# Patient Record
Sex: Male | Born: 1945 | Race: Black or African American | Hispanic: No | Marital: Married | Smoking: Former smoker
Health system: Southern US, Community
[De-identification: ages and names within clinical notes are randomized; demographics above are authoritative.]

## PROBLEM LIST (undated history)

## (undated) DIAGNOSIS — G4730 Sleep apnea, unspecified: Secondary | ICD-10-CM

## (undated) DIAGNOSIS — Z974 Presence of external hearing-aid: Secondary | ICD-10-CM

## (undated) DIAGNOSIS — I10 Essential (primary) hypertension: Secondary | ICD-10-CM

## (undated) DIAGNOSIS — Z972 Presence of dental prosthetic device (complete) (partial): Secondary | ICD-10-CM

## (undated) DIAGNOSIS — E669 Obesity, unspecified: Secondary | ICD-10-CM

## (undated) DIAGNOSIS — E119 Type 2 diabetes mellitus without complications: Secondary | ICD-10-CM

## (undated) DIAGNOSIS — E785 Hyperlipidemia, unspecified: Secondary | ICD-10-CM

## (undated) DIAGNOSIS — M1990 Unspecified osteoarthritis, unspecified site: Secondary | ICD-10-CM

## (undated) DIAGNOSIS — E1169 Type 2 diabetes mellitus with other specified complication: Secondary | ICD-10-CM

---

## 2006-04-28 ENCOUNTER — Ambulatory Visit: Payer: Self-pay | Admitting: Podiatry

## 2012-06-24 DIAGNOSIS — N529 Male erectile dysfunction, unspecified: Secondary | ICD-10-CM | POA: Insufficient documentation

## 2012-09-16 DIAGNOSIS — E782 Mixed hyperlipidemia: Secondary | ICD-10-CM | POA: Insufficient documentation

## 2012-09-16 DIAGNOSIS — E1149 Type 2 diabetes mellitus with other diabetic neurological complication: Secondary | ICD-10-CM | POA: Insufficient documentation

## 2012-09-16 DIAGNOSIS — I10 Essential (primary) hypertension: Secondary | ICD-10-CM | POA: Insufficient documentation

## 2012-09-16 DIAGNOSIS — E291 Testicular hypofunction: Secondary | ICD-10-CM | POA: Insufficient documentation

## 2013-10-03 DIAGNOSIS — M25561 Pain in right knee: Secondary | ICD-10-CM | POA: Insufficient documentation

## 2017-12-21 DIAGNOSIS — Z7409 Other reduced mobility: Secondary | ICD-10-CM | POA: Insufficient documentation

## 2019-04-05 DIAGNOSIS — I872 Venous insufficiency (chronic) (peripheral): Secondary | ICD-10-CM | POA: Insufficient documentation

## 2019-07-12 DIAGNOSIS — E785 Hyperlipidemia, unspecified: Secondary | ICD-10-CM | POA: Insufficient documentation

## 2019-07-12 DIAGNOSIS — M1990 Unspecified osteoarthritis, unspecified site: Secondary | ICD-10-CM | POA: Insufficient documentation

## 2019-08-19 DIAGNOSIS — I872 Venous insufficiency (chronic) (peripheral): Secondary | ICD-10-CM

## 2019-08-19 HISTORY — DX: Venous insufficiency (chronic) (peripheral): I87.2

## 2019-08-30 ENCOUNTER — Other Ambulatory Visit (HOSPITAL_COMMUNITY): Payer: Self-pay | Admitting: Physician Assistant

## 2019-08-30 ENCOUNTER — Other Ambulatory Visit: Payer: Self-pay | Admitting: Physician Assistant

## 2019-08-30 DIAGNOSIS — H938X1 Other specified disorders of right ear: Secondary | ICD-10-CM

## 2019-09-07 ENCOUNTER — Ambulatory Visit
Admission: RE | Admit: 2019-09-07 | Discharge: 2019-09-07 | Disposition: A | Discharge status: Home / Self Care | Payer: Medicare Other | Source: Ambulatory Visit | Attending: Physician Assistant | Admitting: Physician Assistant

## 2019-09-07 ENCOUNTER — Other Ambulatory Visit: Payer: Self-pay

## 2019-09-07 DIAGNOSIS — H938X1 Other specified disorders of right ear: Secondary | ICD-10-CM | POA: Diagnosis present

## 2019-09-07 HISTORY — DX: Type 2 diabetes mellitus without complications: E11.9

## 2019-09-07 LAB — POCT I-STAT CREATININE: Creatinine, Ser: 0.9 mg/dL (ref 0.61–1.24)

## 2019-09-07 MED ORDER — IOHEXOL 300 MG/ML  SOLN
75.0000 mL | Freq: Once | INTRAMUSCULAR | Status: AC | PRN
  Administered 2019-09-07: 75 mL via INTRAVENOUS

## 2019-10-04 ENCOUNTER — Encounter: Payer: Self-pay | Admitting: Unknown Physician Specialty

## 2019-10-04 ENCOUNTER — Other Ambulatory Visit: Payer: Self-pay

## 2019-10-04 ENCOUNTER — Encounter
Admission: RE | Admit: 2019-10-04 | Discharge: 2019-10-04 | Disposition: A | Discharge status: Home / Self Care | Payer: Medicare Other | Source: Ambulatory Visit | Attending: Unknown Physician Specialty | Admitting: Unknown Physician Specialty

## 2019-10-04 HISTORY — DX: Unspecified osteoarthritis, unspecified site: M19.90

## 2019-10-04 NOTE — Pre-Procedure Instructions (Signed)
Several attempts have been made to reach patient via his home phone number and his cell, with no success.

## 2019-10-04 NOTE — Patient Instructions (Signed)
INSTRUCTIONS FOR SURGERY     Your surgery is scheduled for:   Tuesday, October 11, 2019     To find out your arrival time for the day of surgery,          please call [phone] between 1 pm and 3 pm on :  Monday, February 22,2021     When you arrive for surgery, report to the SECOND FLOOR OF THE MEDICAL MALL.       Do NOT stop on the first floor to register.    REMEMBER: Instructions that are not followed completely may result in serious medical risk,  up to and including death, or upon the discretion of your surgeon and anesthesiologist,            your surgery may need to be rescheduled.  __X__ 1. Do not eat food after midnight the night before your procedure.                    No gum, candy, lozenger, tic tacs, tums or hard candies.                  ABSOLUTELY NOTHING SOLID IN YOUR MOUTH AFTER MIDNIGHT                    You may drink unlimited clear liquids up to 2 hours before you are scheduled to arrive for surgery.                   Do not drink anything within those 2 hours unless you need to take medicine, then take the                   smallest amount you need.  Clear liquids include:  water, apple juice without pulp,                   any flavor Gatorade, Black coffee, black tea.  Sugar may be added but no dairy/ honey /lemon.                        Broth and jello is not considered a clear liquid.  __x__  2. On the morning of surgery, please brush your teeth with toothpaste and water. You may rinse with                  mouthwash if you wish but DO NOT SWALLOW TOOTHPASTE OR MOUTHWASH  __X___3. NO alcohol for 24 hours before or after surgery.  __x___ 4.  Do NOT smoke or use e-cigarettes for 24 HOURS PRIOR TO SURGERY.                      DO NOT Use any chewable tobacco products for at least 6 hours prior to surgery.  __x___ 5. If you start any new medication after this appointment and prior to surgery, please                 Bring it with you on the day of surgery.  ___x__ 6. Notify your doctor if there is any change in your medical  condition, such as fever, infection, vomitting,                   Diarrhea or any open sores.  __x___ 7.  USE ANTIBACTERIAL SOAP as instructed, the night before surgery OR the day of surgery.                   Once you have washed with this soap, do NOT use any of the following: Powders, perfumes                    or lotions. Please do not wear make up, hairpins, clips or nail polish. You MAY wear deodorant.                   Men may shave their face and neck.  Women need to shave 48 hours prior to surgery.                   DO NOT wear ANY jewelry on the day of surgery. If there are rings that are too tight to                    remove easily, please address this prior to the surgery day. Piercings need to be removed.                                                                     NO METAL ON YOUR BODY.                    Do NOT bring any valuables.  If you came to Pre-Admit testing then you will not need license,                     insurance card or credit card.  If you will be staying overnight, please either leave your things in                     the car or have your family be responsible for these items.                     [facility] IS NOT RESPONSIBLE FOR BELONGINGS OR VALUABLES.  ___X__ 8. DO NOT wear contact lenses on surgery day.  You may not have dentures,                     Hearing aides, contacts or glasses in the operating room. These items can be                    Placed in the Recovery Room to receive immediately after surgery.  __x___ 9. IF YOU ARE SCHEDULED TO GO HOME ON THE SAME DAY, YOU MUST                   Have someone to drive you home and to stay with you  for the first 24 hours.                    Have an arrangement prior to arriving on surgery day.  ___x__ 10. Take the following medications on the morning of  surgery with a sip of  water:                              1.  AMLODIPINE                     2.                     3.                                                _____ 11.  Follow any instructions provided to you by your surgeon.                        Such as enema, clear liquid bowel prep  __X__  12. STOP ASPIRIN AS OF:  TODAY, February 16TH, 2021                       THIS INCLUDES BC POWDERS / GOODIES POWDER  __x___ 13. STOP Anti-inflammatories as of:   TODAY, February 16TH, 2021                      This includes IBUPROFEN / MOTRIN / ADVIL / ALEVE/ NAPROXYN                    YOU MAY TAKE TYLENOL ANY TIME PRIOR TO SURGERY.  _____ 14.  Stop supplements until after surgery.                     This includes:  MULTIVITAMINS // VITAMIN B12   ___X___16.  Stop Metformin 2 full days prior to surgery.  Stop on: Saturday, February 20TH                     TAKE 1/2 OF USUAL INSULIN DOSE ON THE EVENING PRIOR TO SURGERY.                     Do NOT take any diabetes medications on surgery day.  CONTINUE TAKING ATORVASTATIN AND LASIX IN THE EVENING AS USUAL.  __X____17.  Continue to take the following medications but do not take on the morning of surgery:                        LISINOPRIL // METFORMIN  ______18. If staying overnight, please have appropriate shoes to wear to be able to walk around the unit.                   Wear clean and comfortable clothing to the hospital.

## 2019-10-07 ENCOUNTER — Encounter
Admission: RE | Admit: 2019-10-07 | Discharge: 2019-10-07 | Disposition: A | Discharge status: Home / Self Care | Payer: Medicare Other | Source: Ambulatory Visit | Attending: Unknown Physician Specialty | Admitting: Unknown Physician Specialty

## 2019-10-07 ENCOUNTER — Other Ambulatory Visit: Payer: Self-pay

## 2019-10-07 ENCOUNTER — Other Ambulatory Visit: Admission: RE | Admit: 2019-10-07 | Payer: Medicare Other | Source: Ambulatory Visit

## 2019-10-07 DIAGNOSIS — I10 Essential (primary) hypertension: Secondary | ICD-10-CM | POA: Insufficient documentation

## 2019-10-07 DIAGNOSIS — E119 Type 2 diabetes mellitus without complications: Secondary | ICD-10-CM | POA: Insufficient documentation

## 2019-10-07 DIAGNOSIS — Z01818 Encounter for other preprocedural examination: Secondary | ICD-10-CM | POA: Insufficient documentation

## 2019-10-07 DIAGNOSIS — Z20822 Contact with and (suspected) exposure to covid-19: Secondary | ICD-10-CM | POA: Diagnosis not present

## 2019-10-07 LAB — BASIC METABOLIC PANEL
Anion gap: 9 (ref 5–15)
BUN: 15 mg/dL (ref 8–23)
CO2: 26 mmol/L (ref 22–32)
Calcium: 8.7 mg/dL — ABNORMAL LOW (ref 8.9–10.3)
Chloride: 105 mmol/L (ref 98–111)
Creatinine, Ser: 0.87 mg/dL (ref 0.61–1.24)
GFR calc Af Amer: >60 mL/min (ref >60)
GFR calc non Af Amer: >60 mL/min (ref >60)
Glucose, Bld: 65 mg/dL — ABNORMAL LOW (ref 70–99)
Potassium: 3.8 mmol/L (ref 3.5–5.1)
Sodium: 140 mmol/L (ref 135–145)

## 2019-10-07 LAB — SARS CORONAVIRUS 2 (TAT 6-24 HRS): SARS Coronavirus 2: NEGATIVE

## 2019-10-11 ENCOUNTER — Ambulatory Visit: Payer: Medicare Other | Admitting: Certified Registered Nurse Anesthetist

## 2019-10-11 ENCOUNTER — Encounter: Payer: Self-pay | Admitting: Unknown Physician Specialty

## 2019-10-11 ENCOUNTER — Other Ambulatory Visit: Payer: Self-pay

## 2019-10-11 ENCOUNTER — Encounter
Admission: RE | Disposition: A | Discharge status: Home / Self Care | Payer: Self-pay | Source: Home / Self Care | Attending: Unknown Physician Specialty

## 2019-10-11 ENCOUNTER — Ambulatory Visit
Admission: RE | Admit: 2019-10-11 | Discharge: 2019-10-11 | Disposition: A | Discharge status: Home / Self Care | Payer: Medicare Other | Attending: Unknown Physician Specialty | Admitting: Unknown Physician Specialty

## 2019-10-11 DIAGNOSIS — E785 Hyperlipidemia, unspecified: Secondary | ICD-10-CM | POA: Insufficient documentation

## 2019-10-11 DIAGNOSIS — H7441 Polyp of right middle ear: Secondary | ICD-10-CM | POA: Diagnosis present

## 2019-10-11 DIAGNOSIS — Z6841 Body Mass Index (BMI) 40.0 and over, adult: Secondary | ICD-10-CM | POA: Diagnosis not present

## 2019-10-11 DIAGNOSIS — G4730 Sleep apnea, unspecified: Secondary | ICD-10-CM | POA: Insufficient documentation

## 2019-10-11 DIAGNOSIS — E669 Obesity, unspecified: Secondary | ICD-10-CM | POA: Insufficient documentation

## 2019-10-11 DIAGNOSIS — Z7982 Long term (current) use of aspirin: Secondary | ICD-10-CM | POA: Insufficient documentation

## 2019-10-11 DIAGNOSIS — E119 Type 2 diabetes mellitus without complications: Secondary | ICD-10-CM | POA: Diagnosis not present

## 2019-10-11 DIAGNOSIS — Z794 Long term (current) use of insulin: Secondary | ICD-10-CM | POA: Diagnosis not present

## 2019-10-11 DIAGNOSIS — Z87891 Personal history of nicotine dependence: Secondary | ICD-10-CM | POA: Diagnosis not present

## 2019-10-11 DIAGNOSIS — I10 Essential (primary) hypertension: Secondary | ICD-10-CM | POA: Diagnosis not present

## 2019-10-11 DIAGNOSIS — Z79899 Other long term (current) drug therapy: Secondary | ICD-10-CM | POA: Insufficient documentation

## 2019-10-11 DIAGNOSIS — M1990 Unspecified osteoarthritis, unspecified site: Secondary | ICD-10-CM | POA: Diagnosis not present

## 2019-10-11 DIAGNOSIS — I872 Venous insufficiency (chronic) (peripheral): Secondary | ICD-10-CM | POA: Diagnosis not present

## 2019-10-11 DIAGNOSIS — Z833 Family history of diabetes mellitus: Secondary | ICD-10-CM | POA: Insufficient documentation

## 2019-10-11 HISTORY — DX: Essential (primary) hypertension: I10

## 2019-10-11 HISTORY — DX: Sleep apnea, unspecified: G47.30

## 2019-10-11 HISTORY — PX: MASS EXCISION: SHX2000

## 2019-10-11 HISTORY — DX: Type 2 diabetes mellitus with other specified complication: E11.69

## 2019-10-11 HISTORY — DX: Obesity, unspecified: E66.9

## 2019-10-11 HISTORY — DX: Type 2 diabetes mellitus with other specified complication: E78.5

## 2019-10-11 LAB — GLUCOSE, CAPILLARY
Glucose-Capillary: 138 mg/dL — ABNORMAL HIGH (ref 70–99)
Glucose-Capillary: 127 mg/dL — ABNORMAL HIGH (ref 70–99)

## 2019-10-11 SURGERY — EXCISION MASS
Anesthesia: General | Laterality: Right

## 2019-10-11 MED ORDER — FENTANYL CITRATE (PF) 100 MCG/2ML IJ SOLN
INTRAMUSCULAR | Status: AC
  Filled 2019-10-11: qty 2

## 2019-10-11 MED ORDER — SUCCINYLCHOLINE CHLORIDE 20 MG/ML IJ SOLN
INTRAMUSCULAR | Status: DC | PRN
  Administered 2019-10-11: 160 mg via INTRAVENOUS

## 2019-10-11 MED ORDER — GLYCOPYRROLATE 0.2 MG/ML IJ SOLN
INTRAMUSCULAR | Status: DC | PRN
  Administered 2019-10-11: .2 mg via INTRAVENOUS

## 2019-10-11 MED ORDER — SUCCINYLCHOLINE CHLORIDE 20 MG/ML IJ SOLN
INTRAMUSCULAR | Status: AC
  Filled 2019-10-11: qty 1

## 2019-10-11 MED ORDER — OXYCODONE HCL 5 MG PO TABS
5.0000 mg | ORAL_TABLET | Freq: Once | ORAL | Status: AC | PRN
  Administered 2019-10-11: 5 mg via ORAL

## 2019-10-11 MED ORDER — PHENYLEPHRINE HCL (PRESSORS) 10 MG/ML IV SOLN
INTRAVENOUS | Status: DC | PRN
  Administered 2019-10-11: 200 ug via INTRAVENOUS
  Administered 2019-10-11: 100 ug via INTRAVENOUS
  Administered 2019-10-11: 200 ug via INTRAVENOUS
  Administered 2019-10-11: 100 ug via INTRAVENOUS
  Administered 2019-10-11: 200 ug via INTRAVENOUS

## 2019-10-11 MED ORDER — PROPOFOL 10 MG/ML IV BOLUS
INTRAVENOUS | Status: AC
  Filled 2019-10-11: qty 20

## 2019-10-11 MED ORDER — CIPROFLOXACIN HCL 500 MG PO TABS: 500.0000 mg | ORAL_TABLET | Freq: Two times a day (BID) | ORAL | 0 refills | Status: AC

## 2019-10-11 MED ORDER — ONDANSETRON HCL 4 MG/2ML IJ SOLN
INTRAMUSCULAR | Status: AC
  Filled 2019-10-11: qty 2

## 2019-10-11 MED ORDER — CIPROFLOXACIN-DEXAMETHASONE 0.3-0.1 % OT SUSP
OTIC | Status: AC
  Filled 2019-10-11: qty 7.5

## 2019-10-11 MED ORDER — GLYCOPYRROLATE 0.2 MG/ML IJ SOLN
INTRAMUSCULAR | Status: AC
  Filled 2019-10-11: qty 1

## 2019-10-11 MED ORDER — PROPOFOL 10 MG/ML IV BOLUS
INTRAVENOUS | Status: DC | PRN
  Administered 2019-10-11: 50 mg via INTRAVENOUS
  Administered 2019-10-11: 200 mg via INTRAVENOUS

## 2019-10-11 MED ORDER — GELATIN ABSORBABLE 12-7 MM EX MISC
CUTANEOUS | Status: AC
  Filled 2019-10-11: qty 1

## 2019-10-11 MED ORDER — OXYCODONE HCL 5 MG/5ML PO SOLN: 5.0000 mg | Freq: Once | ORAL | Status: AC | PRN

## 2019-10-11 MED ORDER — ONDANSETRON HCL 4 MG/2ML IJ SOLN
INTRAMUSCULAR | Status: DC | PRN
  Administered 2019-10-11: 4 mg via INTRAVENOUS

## 2019-10-11 MED ORDER — EPHEDRINE SULFATE 50 MG/ML IJ SOLN
INTRAMUSCULAR | Status: AC
  Filled 2019-10-11: qty 1

## 2019-10-11 MED ORDER — SODIUM CHLORIDE 0.9 % IV SOLN
INTRAVENOUS | Status: DC
  Administered 2019-10-11: 100 mL/h via INTRAVENOUS

## 2019-10-11 MED ORDER — FENTANYL CITRATE (PF) 100 MCG/2ML IJ SOLN
25.0000 ug | INTRAMUSCULAR | Status: DC | PRN
  Administered 2019-10-11: 50 ug via INTRAVENOUS
  Administered 2019-10-11: 25 ug via INTRAVENOUS

## 2019-10-11 MED ORDER — GELATIN ABSORBABLE 100 CM EX MISC
CUTANEOUS | Status: AC
  Filled 2019-10-11: qty 1

## 2019-10-11 MED ORDER — BACITRACIN ZINC 500 UNIT/GM EX OINT
TOPICAL_OINTMENT | CUTANEOUS | Status: AC
  Filled 2019-10-11: qty 28.35

## 2019-10-11 MED ORDER — ROCURONIUM BROMIDE 50 MG/5ML IV SOLN
INTRAVENOUS | Status: AC
  Filled 2019-10-11: qty 1

## 2019-10-11 MED ORDER — EPINEPHRINE PF 1 MG/ML IJ SOLN
INTRAMUSCULAR | Status: AC
  Filled 2019-10-11: qty 1

## 2019-10-11 MED ORDER — PHENYLEPHRINE HCL (PRESSORS) 10 MG/ML IV SOLN
INTRAVENOUS | Status: AC
  Filled 2019-10-11: qty 1

## 2019-10-11 MED ORDER — LIDOCAINE HCL (CARDIAC) PF 100 MG/5ML IV SOSY
PREFILLED_SYRINGE | INTRAVENOUS | Status: DC | PRN
  Administered 2019-10-11: 100 mg via INTRAVENOUS

## 2019-10-11 MED ORDER — DEXAMETHASONE SODIUM PHOSPHATE 10 MG/ML IJ SOLN
INTRAMUSCULAR | Status: DC | PRN
  Administered 2019-10-11: 10 mg via INTRAVENOUS

## 2019-10-11 MED ORDER — FAMOTIDINE 20 MG PO TABS
ORAL_TABLET | ORAL | Status: AC
  Administered 2019-10-11: 20 mg via ORAL
  Filled 2019-10-11: qty 1

## 2019-10-11 MED ORDER — LIDOCAINE HCL (PF) 2 % IJ SOLN
INTRAMUSCULAR | Status: AC
  Filled 2019-10-11: qty 10

## 2019-10-11 MED ORDER — FENTANYL CITRATE (PF) 100 MCG/2ML IJ SOLN
INTRAMUSCULAR | Status: DC | PRN
  Administered 2019-10-11: 50 ug via INTRAVENOUS

## 2019-10-11 MED ORDER — LIDOCAINE-EPINEPHRINE 1 %-1:100000 IJ SOLN
INTRAMUSCULAR | Status: AC
  Filled 2019-10-11: qty 1

## 2019-10-11 MED ORDER — DEXAMETHASONE SODIUM PHOSPHATE 10 MG/ML IJ SOLN
INTRAMUSCULAR | Status: AC
  Filled 2019-10-11: qty 1

## 2019-10-11 MED ORDER — EPINEPHRINE HCL (NASAL) 0.1 % NA SOLN
NASAL | Status: DC | PRN
  Administered 2019-10-11: 1 [drp] via TOPICAL

## 2019-10-11 MED ORDER — HYDROCODONE-ACETAMINOPHEN 5-300 MG PO TABS: 1.0000 | ORAL_TABLET | ORAL | 0 refills | Status: DC | PRN

## 2019-10-11 MED ORDER — FAMOTIDINE 20 MG PO TABS: 20.0000 mg | ORAL_TABLET | Freq: Once | ORAL | Status: AC

## 2019-10-11 MED ORDER — OXYCODONE HCL 5 MG PO TABS
ORAL_TABLET | ORAL | Status: AC
  Filled 2019-10-11: qty 1

## 2019-10-11 SURGICAL SUPPLY — 19 items
BLADE SURG 15 STRL LF DISP TIS (BLADE) ×1 IMPLANT
BLADE SURG 15 STRL SS (BLADE) ×2
CANISTER SUCT 1200ML W/VALVE (MISCELLANEOUS) ×3 IMPLANT
COTTON BALL STRL MEDIUM (GAUZE/BANDAGES/DRESSINGS) ×2 IMPLANT
COVER WAND RF STERILE (DRAPES) ×3 IMPLANT
ELECT REM PT RETURN 9FT ADLT (ELECTROSURGICAL) ×3
ELECTRODE REM PT RTRN 9FT ADLT (ELECTROSURGICAL) ×1 IMPLANT
GLOVE BIO SURGEON STRL SZ7.5 (GLOVE) ×3 IMPLANT
GOWN STRL REUS W/ TWL LRG LVL3 (GOWN DISPOSABLE) ×2 IMPLANT
GOWN STRL REUS W/TWL LRG LVL3 (GOWN DISPOSABLE) ×4
LABEL OR SOLS (LABEL) ×3 IMPLANT
NDL FILTER BLUNT 18X1 1/2 (NEEDLE) IMPLANT
NEEDLE FILTER BLUNT 18X 1/2SAF (NEEDLE) ×2
NEEDLE FILTER BLUNT 18X1 1/2 (NEEDLE) ×1 IMPLANT
NS IRRIG 500ML POUR BTL (IV SOLUTION) ×3 IMPLANT
PACK HEAD/NECK (MISCELLANEOUS) ×3 IMPLANT
SUCTION FRAZIER HANDLE 10FR (MISCELLANEOUS) ×2
SUCTION TUBE FRAZIER 10FR DISP (MISCELLANEOUS) ×1 IMPLANT
merocel pope ear wick ×2 IMPLANT

## 2019-10-11 NOTE — Anesthesia Procedure Notes (Signed)
Procedure Name: Intubation Date/Time: 10/11/2019 7:29 AM Performed by: Ginger Carne, CRNA Pre-anesthesia Checklist: Patient identified, Emergency Drugs available, Suction available, Patient being monitored and Timeout performed Patient Re-evaluated:Patient Re-evaluated prior to induction Oxygen Delivery Method: Circle system utilized Preoxygenation: Pre-oxygenation with 100% oxygen Induction Type: IV induction Ventilation: Mask ventilation with difficulty and Oral airway inserted - appropriate to patient size Laryngoscope Size: McGraph and 3 Grade View: Grade II Tube type: Oral Tube size: 8.0 mm Number of attempts: 1 Airway Equipment and Method: Stylet,  Video-laryngoscopy and Oral airway Placement Confirmation: ETT inserted through vocal cords under direct vision,  positive ETCO2 and breath sounds checked- equal and bilateral Secured at: 22 cm Tube secured with: Tape Dental Injury: Teeth and Oropharynx as per pre-operative assessment  Difficulty Due To: Difficulty was anticipated, Difficult Airway- due to large tongue, Difficult Airway- due to limited oral opening and Difficult Airway- due to reduced neck mobility

## 2019-10-11 NOTE — Op Note (Signed)
10/11/2019  8:18 AM    Daniel Sanders  [id number]   Pre-Op Dx: right ear polyp  Post-op Dx: SAME  Proc: Excision right ear canal polyp  Surg:  Davina Poke  Anes:  GOT  EBL: Less than 5 cc  Comp: None  Findings: Polypoid mass emanating from the superior aspect of the ear canal tympanic membrane appeared intact  Procedure: [name] was identified in the holding area take the operating room placed in supine position.  After general endotracheal anesthesia the table was turned 90 degrees.  The right ear was prepped and draped sterilely.  The operating microscope was brought on the field examination of the canal showed edema of the canal superiorly more medially is a polypoid mass emanating from the superior aspect of the ear canal.  Beyond this tympanic membrane appeared intact.  Cup forceps were then used to remove the polyp in its entirety this were sent for permanent section.  Cottonoid pledget with 1 with thousand adrenaline was placed within the ear canal left approximately 5 minutes for hemostasis.  This was then removed reexamination showed excellent removal of the polypoid mass.  There continue to be some edema of the canal superiorly and laterally to the mass.  A culture was taken from the base of the polyp of been excised.  [place]ball with adrenaline was then replaced again left 5 minutes and then removed.  With no active bleeding a otowick was placed in the ear canal Ciprodex drops were then instilled in the canal onto the otowick.  The patient was then returned to anesthesia where he was awakened in the operating room taken recovery room in stable condition.  Cultures: Right ear  Specimen: Right ear polyp  Dispo:   Good  Plan: Discharged home on oral ciprofloxacin and and Ciprodex drops with otowick in place.  Patient return in 5 days for wick removal.  Davina Poke  10/11/2019 8:18 AM

## 2019-10-11 NOTE — Discharge Instructions (Signed)
AMBULATORY SURGERY  °DISCHARGE INSTRUCTIONS ° ° °1) The drugs that you were given will stay in your system until tomorrow so for the next 24 hours you should not: ° °A) Drive an automobile °B) Make any legal decisions °C) Drink any alcoholic beverage ° ° °2) You may resume regular meals tomorrow.  Today it is better to start with liquids and gradually work up to solid foods. ° °You may eat anything you prefer, but it is better to start with liquids, then soup and crackers, and gradually work up to solid foods. ° ° °3) Please notify your doctor immediately if you have any unusual bleeding, trouble breathing, redness and pain at the surgery site, drainage, fever, or pain not relieved by medication. ° ° ° °4) Additional Instructions: ° ° ° ° ° ° ° °Please contact your physician with any problems or Same Day Surgery at [phone], Monday through Friday 6 am to 4 pm, or [facility] Main number at [phone]. °

## 2019-10-11 NOTE — H&P (Signed)
The patient's history has been reviewed, patient examined, no change in status, stable for surgery.  Questions were answered to the patients satisfaction.  

## 2019-10-11 NOTE — OR Nursing (Signed)
Per Dr. Jenne Campus verbal, pt may resume aspirin tomorrow 10/12/19.   Added to discharge instructions.

## 2019-10-11 NOTE — Anesthesia Postprocedure Evaluation (Signed)
Anesthesia Post Note  Patient: Daniel Sanders  Procedure(s) Performed: EXCISION EAR CANAL MASS (Right )  Patient location during evaluation: PACU Anesthesia Type: General Level of consciousness: awake and alert Pain management: pain level controlled Vital Signs Assessment: post-procedure vital signs reviewed and stable Respiratory status: spontaneous breathing, nonlabored ventilation, respiratory function stable and patient connected to nasal cannula oxygen Cardiovascular status: blood pressure returned to baseline and stable Postop Assessment: no apparent nausea or vomiting Anesthetic complications: no     Last Vitals:  Vitals:   10/11/19 0855 10/11/19 0909  BP: 116/61 (!) 113/47  Pulse: 84 78  Resp: 17 16  Temp: (!) 36.1 C (!) 36.3 C  SpO2: 91% 95%    Last Pain:  Vitals:   10/11/19 0909  TempSrc: Temporal  PainSc: 4                  Cleda Mccreedy Pranathi Winfree

## 2019-10-11 NOTE — Transfer of Care (Signed)
Immediate Anesthesia Transfer of Care Note  Patient: Daniel Sanders  Procedure(s) Performed: EXCISION EAR CANAL MASS (Right )  Patient Location: PACU  Anesthesia Type:General  Level of Consciousness: awake, alert  and oriented  Airway & Oxygen Therapy: Patient Spontanous Breathing and Patient connected to face mask oxygen  Post-op Assessment: Report given to RN and Post -op Vital signs reviewed and stable  Post vital signs: Reviewed and stable  Last Vitals:  Vitals Value Taken Time  BP 114/61 10/11/19 0825  Temp 36.2 C 10/11/19 0825  Pulse 81 10/11/19 0826  Resp 12 10/11/19 0826  SpO2 96 % 10/11/19 0826  Vitals shown include unvalidated device data.  Last Pain:  Vitals:   10/11/19 0635  TempSrc: Tympanic  PainSc: 4          Complications: No apparent anesthesia complications

## 2019-10-11 NOTE — Anesthesia Preprocedure Evaluation (Signed)
Anesthesia Evaluation  Patient identified by MRN, date of birth, ID band Patient awake    Reviewed: Allergy & Precautions, H&P , NPO status , Patient's Chart, lab work & pertinent test results  History of Anesthesia Complications Negative for: history of anesthetic complications  Airway Mallampati: III  TM Distance: >3 FB Neck ROM: limited    Dental  (+) Chipped, Poor Dentition, Missing, Edentulous Upper   Pulmonary neg shortness of breath, sleep apnea , former smoker,           Cardiovascular Exercise Tolerance: Good hypertension, (-) angina(-) Past MI and (-) DOE      Neuro/Psych negative neurological ROS  negative psych ROS   GI/Hepatic negative GI ROS, Neg liver ROS, neg GERD  ,  Endo/Other  diabetes, Type 2  Renal/GU      Musculoskeletal  (+) Arthritis ,   Abdominal   Peds  Hematology negative hematology ROS (+)   Anesthesia Other Findings Past Medical History: No date: Arthritis No date: Diabetes mellitus without complication (HCC) No date: Hyperlipidemia associated with type 2 diabetes mellitus (HCC) No date: Hypertension No date: Obesity No date: Sleep apnea     Comment:  per patient, incorrect 2021: Venous insufficiency of both lower extremities  History reviewed. No pertinent surgical history.  BMI    Body Mass Index: 47.84 kg/m      Reproductive/Obstetrics negative OB ROS                             Anesthesia Physical Anesthesia Plan  ASA: III  Anesthesia Plan: General ETT   Post-op Pain Management:    Induction: Intravenous  PONV Risk Score and Plan: Ondansetron, Dexamethasone, Midazolam and Treatment may vary due to age or medical condition  Airway Management Planned: Oral ETT  Additional Equipment:   Intra-op Plan:   Post-operative Plan: Extubation in OR  Informed Consent: I have reviewed the patients History and Physical, chart, labs and  discussed the procedure including the risks, benefits and alternatives for the proposed anesthesia with the patient or authorized representative who has indicated his/her understanding and acceptance.     Dental Advisory Given  Plan Discussed with: Anesthesiologist, CRNA and Surgeon  Anesthesia Plan Comments: (Patient consented for risks of anesthesia including but not limited to:  - adverse reactions to medications - damage to teeth, lips or other oral mucosa - sore throat or hoarseness - Damage to heart, brain, lungs or loss of life  Patient voiced understanding.)        Anesthesia Quick Evaluation

## 2019-10-12 ENCOUNTER — Other Ambulatory Visit: Payer: Federal, State, Local not specified - PPO

## 2019-10-12 LAB — SURGICAL PATHOLOGY

## 2019-10-14 LAB — AEROBIC CULTURE W GRAM STAIN (SUPERFICIAL SPECIMEN)

## 2019-10-16 LAB — ANAEROBIC CULTURE

## 2020-03-13 ENCOUNTER — Emergency Department: Payer: Medicare Other

## 2020-03-13 ENCOUNTER — Inpatient Hospital Stay
Admission: EM | Admit: 2020-03-13 | Discharge: 2020-03-19 | DRG: 540 | Disposition: A | Discharge status: Home Health | Payer: Medicare Other | Attending: Internal Medicine | Admitting: Internal Medicine

## 2020-03-13 ENCOUNTER — Other Ambulatory Visit: Payer: Self-pay

## 2020-03-13 DIAGNOSIS — M868X8 Other osteomyelitis, other site: Secondary | ICD-10-CM | POA: Diagnosis present

## 2020-03-13 DIAGNOSIS — J392 Other diseases of pharynx: Secondary | ICD-10-CM

## 2020-03-13 DIAGNOSIS — E785 Hyperlipidemia, unspecified: Secondary | ICD-10-CM | POA: Diagnosis present

## 2020-03-13 DIAGNOSIS — Z833 Family history of diabetes mellitus: Secondary | ICD-10-CM

## 2020-03-13 DIAGNOSIS — Y92239 Unspecified place in hospital as the place of occurrence of the external cause: Secondary | ICD-10-CM | POA: Diagnosis not present

## 2020-03-13 DIAGNOSIS — M869 Osteomyelitis, unspecified: Secondary | ICD-10-CM | POA: Diagnosis not present

## 2020-03-13 DIAGNOSIS — I10 Essential (primary) hypertension: Secondary | ICD-10-CM | POA: Diagnosis present

## 2020-03-13 DIAGNOSIS — Z20822 Contact with and (suspected) exposure to covid-19: Secondary | ICD-10-CM | POA: Diagnosis present

## 2020-03-13 DIAGNOSIS — Z87891 Personal history of nicotine dependence: Secondary | ICD-10-CM

## 2020-03-13 DIAGNOSIS — R42 Dizziness and giddiness: Secondary | ICD-10-CM

## 2020-03-13 DIAGNOSIS — R531 Weakness: Secondary | ICD-10-CM

## 2020-03-13 DIAGNOSIS — Z79899 Other long term (current) drug therapy: Secondary | ICD-10-CM | POA: Diagnosis not present

## 2020-03-13 DIAGNOSIS — E1165 Type 2 diabetes mellitus with hyperglycemia: Secondary | ICD-10-CM | POA: Diagnosis not present

## 2020-03-13 DIAGNOSIS — Z515 Encounter for palliative care: Secondary | ICD-10-CM | POA: Diagnosis not present

## 2020-03-13 DIAGNOSIS — E119 Type 2 diabetes mellitus without complications: Secondary | ICD-10-CM | POA: Diagnosis not present

## 2020-03-13 DIAGNOSIS — E669 Obesity, unspecified: Secondary | ICD-10-CM | POA: Diagnosis present

## 2020-03-13 DIAGNOSIS — H60391 Other infective otitis externa, right ear: Secondary | ICD-10-CM

## 2020-03-13 DIAGNOSIS — B965 Pseudomonas (aeruginosa) (mallei) (pseudomallei) as the cause of diseases classified elsewhere: Secondary | ICD-10-CM | POA: Diagnosis present

## 2020-03-13 DIAGNOSIS — Z7982 Long term (current) use of aspirin: Secondary | ICD-10-CM

## 2020-03-13 DIAGNOSIS — E860 Dehydration: Secondary | ICD-10-CM | POA: Diagnosis present

## 2020-03-13 DIAGNOSIS — I872 Venous insufficiency (chronic) (peripheral): Secondary | ICD-10-CM | POA: Diagnosis present

## 2020-03-13 DIAGNOSIS — Z794 Long term (current) use of insulin: Secondary | ICD-10-CM | POA: Diagnosis not present

## 2020-03-13 DIAGNOSIS — T380X5A Adverse effect of glucocorticoids and synthetic analogues, initial encounter: Secondary | ICD-10-CM | POA: Diagnosis not present

## 2020-03-13 DIAGNOSIS — Z7189 Other specified counseling: Secondary | ICD-10-CM | POA: Diagnosis not present

## 2020-03-13 DIAGNOSIS — G4730 Sleep apnea, unspecified: Secondary | ICD-10-CM | POA: Diagnosis present

## 2020-03-13 DIAGNOSIS — R404 Transient alteration of awareness: Secondary | ICD-10-CM | POA: Diagnosis not present

## 2020-03-13 DIAGNOSIS — M1990 Unspecified osteoarthritis, unspecified site: Secondary | ICD-10-CM | POA: Diagnosis present

## 2020-03-13 DIAGNOSIS — E1169 Type 2 diabetes mellitus with other specified complication: Secondary | ICD-10-CM | POA: Diagnosis present

## 2020-03-13 DIAGNOSIS — E222 Syndrome of inappropriate secretion of antidiuretic hormone: Secondary | ICD-10-CM | POA: Diagnosis present

## 2020-03-13 DIAGNOSIS — Z8673 Personal history of transient ischemic attack (TIA), and cerebral infarction without residual deficits: Secondary | ICD-10-CM

## 2020-03-13 DIAGNOSIS — H6021 Malignant otitis externa, right ear: Secondary | ICD-10-CM | POA: Diagnosis present

## 2020-03-13 DIAGNOSIS — Z6841 Body Mass Index (BMI) 40.0 and over, adult: Secondary | ICD-10-CM | POA: Diagnosis not present

## 2020-03-13 DIAGNOSIS — D649 Anemia, unspecified: Secondary | ICD-10-CM | POA: Diagnosis present

## 2020-03-13 DIAGNOSIS — M8618 Other acute osteomyelitis, other site: Secondary | ICD-10-CM | POA: Diagnosis not present

## 2020-03-13 DIAGNOSIS — Z9181 History of falling: Secondary | ICD-10-CM

## 2020-03-13 DIAGNOSIS — Z8249 Family history of ischemic heart disease and other diseases of the circulatory system: Secondary | ICD-10-CM

## 2020-03-13 LAB — CBC WITH DIFFERENTIAL/PLATELET
Abs Immature Granulocytes: 0.04 10*3/uL (ref 0.00–0.07)
Basophils Absolute: 0.1 10*3/uL (ref 0.0–0.1)
Basophils Relative: 1 %
Eosinophils Absolute: 0.1 10*3/uL (ref 0.0–0.5)
Eosinophils Relative: 1 %
HCT: 35.5 % — ABNORMAL LOW (ref 39.0–52.0)
Hemoglobin: 11.3 g/dL — ABNORMAL LOW (ref 13.0–17.0)
Immature Granulocytes: 0 %
Lymphocytes Relative: 48 %
Lymphs Abs: 4.2 10*3/uL — ABNORMAL HIGH (ref 0.7–4.0)
MCH: 27.2 pg (ref 26.0–34.0)
MCHC: 31.8 g/dL (ref 30.0–36.0)
MCV: 85.5 fL (ref 80.0–100.0)
Monocytes Absolute: 0.8 10*3/uL (ref 0.1–1.0)
Monocytes Relative: 9 %
Neutro Abs: 3.6 10*3/uL (ref 1.7–7.7)
Neutrophils Relative %: 41 %
Platelets: 350 10*3/uL (ref 150–400)
RBC: 4.15 MIL/uL — ABNORMAL LOW (ref 4.22–5.81)
RDW: 14.2 % (ref 11.5–15.5)
WBC: 8.9 10*3/uL (ref 4.0–10.5)
nRBC: 0 % (ref 0.0–0.2)

## 2020-03-13 LAB — COMPREHENSIVE METABOLIC PANEL
ALT: 21 U/L (ref 0–44)
AST: 21 U/L (ref 15–41)
Albumin: 2.8 g/dL — ABNORMAL LOW (ref 3.5–5.0)
Alkaline Phosphatase: 80 U/L (ref 38–126)
Anion gap: 11 (ref 5–15)
BUN: 12 mg/dL (ref 8–23)
CO2: 23 mmol/L (ref 22–32)
Calcium: 8.5 mg/dL — ABNORMAL LOW (ref 8.9–10.3)
Chloride: 99 mmol/L (ref 98–111)
Creatinine, Ser: 0.78 mg/dL (ref 0.61–1.24)
GFR calc Af Amer: >60 mL/min (ref >60)
GFR calc non Af Amer: >60 mL/min (ref >60)
Glucose, Bld: 123 mg/dL — ABNORMAL HIGH (ref 70–99)
Potassium: 4.5 mmol/L (ref 3.5–5.1)
Sodium: 133 mmol/L — ABNORMAL LOW (ref 135–145)
Total Bilirubin: 0.7 mg/dL (ref 0.3–1.2)
Total Protein: 7.6 g/dL (ref 6.5–8.1)

## 2020-03-13 LAB — TROPONIN I (HIGH SENSITIVITY)
Troponin I (High Sensitivity): 9 ng/L (ref <18)
Troponin I (High Sensitivity): 7 ng/L (ref <18)

## 2020-03-13 LAB — LACTIC ACID, PLASMA
Lactic Acid, Venous: 2.7 mmol/L (ref 0.5–1.9)
Lactic Acid, Venous: 2 mmol/L (ref 0.5–1.9)

## 2020-03-13 LAB — GLUCOSE, CAPILLARY: Glucose-Capillary: 102 mg/dL — ABNORMAL HIGH (ref 70–99)

## 2020-03-13 MED ORDER — TRAZODONE HCL 50 MG PO TABS: 25.0000 mg | ORAL_TABLET | Freq: Every evening | ORAL | Status: DC | PRN

## 2020-03-13 MED ORDER — LISINOPRIL 20 MG PO TABS
40.0000 mg | ORAL_TABLET | Freq: Two times a day (BID) | ORAL | Status: DC
  Administered 2020-03-14 – 2020-03-19 (×9): 40 mg via ORAL
  Filled 2020-03-13 (×6): qty 2
  Filled 2020-03-13: qty 4
  Filled 2020-03-13 (×4): qty 2

## 2020-03-13 MED ORDER — SODIUM CHLORIDE 0.9 % IV BOLUS
500.0000 mL | Freq: Once | INTRAVENOUS | Status: AC
  Administered 2020-03-13: 500 mL via INTRAVENOUS

## 2020-03-13 MED ORDER — CIPROFLOXACIN HCL 500 MG PO TABS
500.0000 mg | ORAL_TABLET | Freq: Two times a day (BID) | ORAL | Status: DC
  Administered 2020-03-14 (×2): 500 mg via ORAL
  Filled 2020-03-13 (×2): qty 1

## 2020-03-13 MED ORDER — ACETAMINOPHEN 650 MG RE SUPP: 650.0000 mg | Freq: Four times a day (QID) | RECTAL | Status: DC | PRN

## 2020-03-13 MED ORDER — ONDANSETRON HCL 4 MG PO TABS: 4.0000 mg | ORAL_TABLET | Freq: Four times a day (QID) | ORAL | Status: DC | PRN

## 2020-03-13 MED ORDER — DEXAMETHASONE SODIUM PHOSPHATE 10 MG/ML IJ SOLN
10.0000 mg | Freq: Once | INTRAMUSCULAR | Status: AC
  Administered 2020-03-13: 10 mg via INTRAVENOUS
  Filled 2020-03-13: qty 1

## 2020-03-13 MED ORDER — FUROSEMIDE 40 MG PO TABS
40.0000 mg | ORAL_TABLET | Freq: Every evening | ORAL | Status: DC
  Administered 2020-03-14 – 2020-03-18 (×5): 40 mg via ORAL
  Filled 2020-03-13 (×5): qty 1

## 2020-03-13 MED ORDER — SODIUM CHLORIDE 0.9 % IV SOLN: INTRAVENOUS | Status: DC

## 2020-03-13 MED ORDER — ACETAMINOPHEN 325 MG PO TABS: 650.0000 mg | ORAL_TABLET | Freq: Four times a day (QID) | ORAL | Status: DC | PRN

## 2020-03-13 MED ORDER — AMLODIPINE BESYLATE 5 MG PO TABS
5.0000 mg | ORAL_TABLET | Freq: Every day | ORAL | Status: DC
  Administered 2020-03-14 – 2020-03-19 (×6): 5 mg via ORAL
  Filled 2020-03-13 (×6): qty 1

## 2020-03-13 MED ORDER — MORPHINE SULFATE (PF) 2 MG/ML IV SOLN: 2.0000 mg | INTRAVENOUS | Status: DC | PRN

## 2020-03-13 MED ORDER — HYDROCODONE-ACETAMINOPHEN 5-325 MG PO TABS: 1.0000 | ORAL_TABLET | ORAL | Status: DC | PRN

## 2020-03-13 MED ORDER — INSULIN NPH (HUMAN) (ISOPHANE) 100 UNIT/ML ~~LOC~~ SUSP: 60.0000 [IU] | SUBCUTANEOUS | Status: DC

## 2020-03-13 MED ORDER — ENOXAPARIN SODIUM 40 MG/0.4ML ~~LOC~~ SOLN: 40.0000 mg | SUBCUTANEOUS | Status: DC

## 2020-03-13 MED ORDER — SODIUM CHLORIDE 0.9 % IV SOLN: Freq: Once | INTRAVENOUS | Status: AC

## 2020-03-13 MED ORDER — ONDANSETRON HCL 4 MG/2ML IJ SOLN: 4.0000 mg | Freq: Four times a day (QID) | INTRAMUSCULAR | Status: DC | PRN

## 2020-03-13 MED ORDER — ATORVASTATIN CALCIUM 20 MG PO TABS
20.0000 mg | ORAL_TABLET | Freq: Every day | ORAL | Status: DC
  Administered 2020-03-14 – 2020-03-18 (×6): 20 mg via ORAL
  Filled 2020-03-13 (×6): qty 1

## 2020-03-13 MED ORDER — MAGNESIUM HYDROXIDE 400 MG/5ML PO SUSP: 30.0000 mL | Freq: Every day | ORAL | Status: DC | PRN

## 2020-03-13 MED ORDER — VITAMIN B-12 1000 MCG PO TABS
1000.0000 ug | ORAL_TABLET | Freq: Every day | ORAL | Status: DC
  Administered 2020-03-14 – 2020-03-19 (×6): 1000 ug via ORAL
  Filled 2020-03-13 (×7): qty 1

## 2020-03-13 MED ORDER — INSULIN REGULAR HUMAN 100 UNIT/ML IJ SOLN: 20.0000 [IU] | INTRAMUSCULAR | Status: DC

## 2020-03-13 MED ORDER — IOHEXOL 300 MG/ML  SOLN
75.0000 mL | Freq: Once | INTRAMUSCULAR | Status: AC | PRN
  Administered 2020-03-13: 75 mL via INTRAVENOUS

## 2020-03-13 MED ORDER — SODIUM CHLORIDE 0.9 % IV SOLN
3.0000 g | Freq: Four times a day (QID) | INTRAVENOUS | Status: DC
  Administered 2020-03-14 (×2): 3 g via INTRAVENOUS
  Filled 2020-03-13 (×2): qty 8

## 2020-03-13 MED ORDER — SODIUM CHLORIDE 0.9 % IV SOLN
1.0000 g | Freq: Once | INTRAVENOUS | Status: AC
  Administered 2020-03-13: 1 g via INTRAVENOUS
  Filled 2020-03-13: qty 10

## 2020-03-13 MED ORDER — ASPIRIN EC 81 MG PO TBEC
81.0000 mg | DELAYED_RELEASE_TABLET | Freq: Every day | ORAL | Status: DC
  Administered 2020-03-14 – 2020-03-19 (×6): 81 mg via ORAL
  Filled 2020-03-13 (×6): qty 1

## 2020-03-13 NOTE — H&P (Signed)
[place] at [facility]   PATIENT NAME: Daniel Sanders    MR#:  [id number]  DATE OF BIRTH:  February 13, 1946  DATE OF ADMISSION:  03/13/2020  PRIMARY CARE PHYSICIAN: System, Provider Not In   REQUESTING/REFERRING PHYSICIAN: Willy Eddy, MD  CHIEF COMPLAINT:   Chief Complaint  Patient presents with  . Dizziness    HISTORY OF PRESENT ILLNESS:  Shahzaib Azevedo  is a 74 y.o. 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 intake for the last week.  He denies any sore throat or cough or wheezing or rhinorrhea or nasal congestion.  He denies any dyspnea or chest pain or palpitations.  No fever or chills.  No headache or dizziness or blurred vision.  No paresthesias or focal muscle weakness.  Upon presentation to the emergency room, vital signs were within normal.  Labs revealed mild hyponatremia 133 and lactic acid was 2.7 and later to.  High-sensitivity troponin I was 9 and later 7.  CBC showed mild anemia.  He had blood cultures drawn.  Chest x-ray showed no acute cardiopulmonary disease.  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  It showed new soft tissue mass in the posterior nasopharynx on the right that was further evaluated by a soft tissue neck CT which showed the following: 1. Large infiltrative and poorly defined mass centered at the right fossa of Rosenmuller with associated invasion of the adjacent skull base and right infratemporal fossa, with further lateral  extension to involve the right parotid gland, right TMJ, as well as the right pre and postauricular region as above. Finding is highly concerning for underlying malignancy, with a primary nasopharyngeal carcinoma being the primary differential consideration. This lesion is almost certainly highly aggressive in nature as these changes appear largely new as compared to recent temporal bone CT from 09/07/2019. ENT referral for further workup and consultation recommended. 2. Asymmetric prominence of right-sided cervical lymph nodes as compared to the left, nonspecific and may be reactive. Possible nodal metastases not excluded. 3. Asymmetric hyperdensity and irregularity involving the right parotid gland, which could be related to direct tumor invasion or possibly associated/concomitant parotitis. Correlation with symptomatology recommended. 4. Aortic Atherosclerosis  Contact was made with Dr. Jenne Campus who is aware about the patient and knows him.  The patient was given IV Rocephin and Decadron as well as a bolus of IV normal saline followed by infusion.  PAST MEDICAL HISTORY:   Past Medical History:  Diagnosis Date  . Arthritis   . Diabetes mellitus without complication (HCC)   . Hyperlipidemia associated with type 2 diabetes mellitus (HCC)   . Hypertension   . Obesity   . Sleep apnea    per patient, incorrect  . Venous insufficiency of both lower extremities 2021    PAST SURGICAL HISTORY:   Past Surgical History:  Procedure Laterality Date  . MASS EXCISION Right 10/11/2019   Procedure: EXCISION EAR CANAL MASS;  Surgeon: Linus Salmons, MD;  Location: [facility] ORS;  Service: ENT;  Laterality: Right;    SOCIAL HISTORY:  Social History   Tobacco Use  . Smoking status: Former Smoker    Types: Cigarettes, Pipe  . Smokeless tobacco: Former NeurosurgeonUser    Quit date: 1990  Substance Use Topics  . Alcohol use: Never    Comment: nothing for 36 years    FAMILY HISTORY:  Positive for  diabetes mellitus and hypertension in his mother.  DRUG ALLERGIES:  No Known Allergies  REVIEW OF SYSTEMS:   ROS As per history of present illness. All pertinent systems were reviewed above. Constitutional, HEENT, cardiovascular, respiratory, GI, GU, musculoskeletal, neuro, psychiatric, endocrine, integumentary and hematologic systems were reviewed and are otherwise negative/unremarkable except for positive findings mentioned above in the HPI.   MEDICATIONS AT HOME:   Prior to Admission medications   Medication Sig Start Date End Date Taking? Authorizing Provider  acetaminophen (TYLENOL) 500 MG tablet Take 500 mg by mouth every 6 (six) hours as needed.    [provider]  amLODipine (NORVASC) 5 MG tablet Take 5 mg by mouth daily. 08/26/19   [provider]  aspirin EC 81 MG tablet Take 81 mg by mouth daily.    [provider]  atorvastatin (LIPITOR) 20 MG tablet Take 20 mg by mouth at bedtime. 05/24/19   [provider]  furosemide (LASIX) 40 MG tablet Take 40 mg by mouth every evening. 05/23/19   [provider]  HYDROcodone-Acetaminophen 5-300 MG TABS Take 1-2 tablets by mouth every 4 (four) hours as needed. 10/11/19   Linus SalmonsMcQueen, Chapman, MD  lisinopril (ZESTRIL) 40 MG tablet Take 40 mg by mouth 2 (two) times daily. 06/30/19   [provider]  metFORMIN (GLUCOPHAGE) 1000 MG tablet Take 1,000 mg by mouth 2 (two) times daily. 06/30/19   [provider]  Multiple Vitamin (MULTIVITAMIN WITH MINERALS) TABS tablet Take 1 tablet by mouth daily. Men's 50+    [provider]  NOVOLIN N 100 UNIT/ML injection Inject 60-70 Units into the skin See admin instructions. Inject 70 units subcutaneously in the morning & inject 60 units subcutaneously in the evening 08/16/19   [provider]  NOVOLIN R 100 UNIT/ML injection Inject 20-25 Units into the skin See admin instructions. Inject 25 units subcutaneously in the morning & inject  20 units subcutaneously in the evening 08/15/19   [provider]  vitamin B-12 (CYANOCOBALAMIN) 1000 MCG tablet Take 1,000 mcg by mouth daily.    [provider]      VITAL SIGNS:  Blood pressure 124/79, pulse 99, temperature 98 F (36.7 C), temperature source Oral, resp. rate 16, height 6\' 2"  (1.88 m), SpO2 99 %.  PHYSICAL EXAMINATION:  Physical Exam  GENERAL:  74 y.o.-year-old African-American male patient lying in the bed with no acute distress.  EYES: Pupils equal, round, reactive to light and accommodation. No scleral icterus. Extraocular muscles intact.  HEENT: Head atraumatic, normocephalic. Oropharynx with moist mucous membrane and tongue with no pharyngeal erythema or exudate.  Right auricular swelling with purulent discharge filling his external canal and blocking his right TM, with no point tenderness. NECK:  Supple, no jugular venous distention. No thyroid enlargement, no tenderness.  LUNGS: Normal breath sounds bilaterally, no wheezing, rales,rhonchi or crepitation. No use of accessory muscles of respiration.  CARDIOVASCULAR: Regular rate and rhythm, S1, S2 normal. No murmurs, rubs, or gallops.  ABDOMEN: Soft, nondistended, nontender. Bowel sounds present. No organomegaly or mass.  EXTREMITIES: No pedal edema, cyanosis, or clubbing.  NEUROLOGIC: Cranial nerves II through XII are intact. Muscle strength 5/5 in  all extremities. Sensation intact. Gait not checked.  PSYCHIATRIC: The patient is alert and oriented x 3.  Normal affect and good eye contact. SKIN: No obvious rash, lesion, or ulcer.   LABORATORY PANEL:   CBC Recent Labs  Lab 03/13/20 1855  WBC 8.9  HGB 11.3*  HCT 35.5*  PLT 350   ------------------------------------------------------------------------------------------------------------------  Chemistries  Recent Labs  Lab 03/13/20 1855  NA 133*  K 4.5  CL 99  CO2 23  GLUCOSE 123*  BUN 12  CREATININE 0.78  CALCIUM 8.5*  AST 21   ALT 21  ALKPHOS 80  BILITOT 0.7   ------------------------------------------------------------------------------------------------------------------  Cardiac Enzymes No results for input(s): TROPONINI in the last 168 hours. ------------------------------------------------------------------------------------------------------------------  RADIOLOGY:  CT Head Wo Contrast  Result Date: 03/13/2020 CLINICAL DATA:  Altered mental status EXAM: CT HEAD WITHOUT CONTRAST TECHNIQUE: Contiguous axial images were obtained from the base of the skull through the vertex without intravenous contrast. COMPARISON:  09/07/2019 FINDINGS: Brain: Mild atrophic changes are noted. No findings to suggest acute hemorrhage, acute infarction or space-occupying mass lesion is seen. There is a chronic almost isodense collection in the subdural space on the right with minimal mass effect and maximum thickness of approximately 6 mm. No midline shift is seen. 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 Vascular: No hyperdense vessel or unexpected calcification. Skull: Normal. Negative for fracture or focal lesion. Sinuses/Orbits: The paranasal sinuses are within normal limits. The mastoid air cells on the left are unremarkable. Fluid attenuation is noted throughout the mastoid air cells on the right as well as within the external auditory canal and middle ear. Other: In the right posterior nasopharynx, there is a 3.1 x 2.0 cm mass lesion identified which is new from the prior exam. This likely occludes the eustachian tube on the right causing the changes in the right middle ear. Additionally there is enlargement of the temporalis and masseter muscles surrounding the right mandible. Soft tissue density is noted on the right adjacent to the right ear as well. IMPRESSION: No acute intracranial abnormality is noted. Mild atrophic changes are seen. Changes consistent with a chronic subdural hematoma on the  right without significant midline shift. No acute component is noted. New soft tissue mass lesion in the posterior nasopharynx on the right with opacification of the mastoid air cells on the right as well as the right middle ear and external auditory canal. Surrounding musculature enlargement is noted which may represent localized invasion. 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 Critical Value/emergent results were called by telephone at the time of interpretation on 03/13/2020 at 7:36 pm to Dr. Willy Eddy , who verbally acknowledged these results. Electronically Signed   By: Alcide Clever M.D.   On: 03/13/2020 19:40   CT Soft Tissue Neck W Contrast  Result Date: 03/13/2020 CLINICAL DATA:  Initial evaluation for neck mass. EXAM: CT NECK WITH CONTRAST TECHNIQUE: Multidetector CT imaging of the neck was performed using the standard protocol following the bolus administration of intravenous contrast. CONTRAST:  34mL OMNIPAQUE IOHEXOL 300 MG/ML  SOLN COMPARISON:  Comparison made with prior head CT from earlier the same day as well as prior temporal bone CT from 09/07/2019. FINDINGS: Pharynx and larynx: Oral cavity within normal limits without discrete mass or collection. No acute abnormality about the remaining dentition. No base of tongue lesion. Palatine tonsils fairly symmetric and within normal limits. There  is an ill-defined infiltrative mass centered at the right fossa of Rosenmuller at the right nasopharynx, corresponding with abnormality seen on prior head CT. Although exact measurements are difficult given the infiltrative nature of the lesion, this measures approximately 4.7 x 3.4 x 4.5 cm (AP by transverse by craniocaudad). Associated invasion of the adjacent skull base with erosive changes of the adjacent right aspect of the clivus as well as the right occipital condyle. Probable involvement of  the right carotid canal as well as the right jugular foramen. Right carotid space is involved just inferior to the skull base with abnormal soft tissue density surrounding the distal cervical right ICA. Extension into and erosive changes about the right hypoglossal canal. Lateral extension into the right infratemporal fossa with loss of normal fat planes throughout the pterygoid musculature (series 2, image 19). Loss of normal fat plane with the adjacent anterior aspect of the right parotid gland, likely involved (series 2, image 30). Involvement of the right temporomandibular joint with erosive changes along the posterior right zygomatic arch, right mandibular condyle, and right EAC. The right EAC is completely opacified. Complete opacification of the right middle ear cavity could reflect postobstructive fluid or possibly tumor. Involvement of the inferior right mastoid air cells as well with associated osseous erosion (series 4, image 23). Associated right mastoid effusion, likely postobstructive for the most part. Further lateral extension with asymmetric enlargement of the right temporalis muscle as well as the right pre and postauricular soft tissues. Soft tissue fullness seen at the right auricle and tragus. Finding highly concerning for an underlying malignancy, almost certainly aggressive in nature as these changes appear largely new as compared to prior temporal bone CT from 09/07/2019. Remainder of the hypopharynx and supraglottic larynx within normal limits. No retropharyngeal collection. Epiglottis normal. Vallecula clear. True cords symmetric and normal. Subglottic airway clear. Salivary glands: Asymmetric hyperdensity and irregularity seen involving the right parotid gland, which could reflect associated and/or concomitant acute parotitis. No obstructive stone or abscess. Remainder of the salivary glands including the left parotid gland and submandibular glands are within normal limits. Thyroid:  Thyroid within normal limits. Lymph nodes: Asymmetric prominence of right-sided cervical lymph nodes seen as compared to the left, with the largest nodes seen at right level II measuring 10 mm in short axis (series 2, image 56). Scattered prominent posterior chain nodes noted on the right as well, largest of which measures 5 mm. No associated necrosis. While these nodes are not technically enlarged by size criteria, the asymmetric prominence and appearance of these nodes raises the possibility for possible nodal metastases. Vascular: Normal intravascular enhancement is seen throughout the neck. In case mint of the distal cervical right ICA by the adjacent skull base tumor again noted. Similarly, the right internal jugular vein is compressed and not visualized at the skull base related to adjacent tumor, although appears patent above and below this level. Moderate atherosclerotic change noted about the aortic arch and carotid siphons. Limited intracranial: Visualized intracranial contents demonstrate no acute finding. No frank invasion of tumor into the intracranial cavity is visible, although this would be better assessed by MRI. Visualized orbits: Globes and orbital soft tissues within normal limits. Mastoids and visualized paranasal sinuses: Mild mucosal thickening noted within the right sphenoid and maxillary sinuses as well as the right ethmoidal air cells. Visualized paranasal sinuses are otherwise clear. Complete opacification of the right middle ear cavity and right mastoid air cells again noted, likely postobstructive for the most part, although direct tumor  invasion not excluded. Left mastoid air cells and middle ear cavity remain clear. Skeleton: No other acute osseous abnormality identified. No other discrete or worrisome osseous lesions. Mild-to-moderate multilevel cervical spondylosis, most pronounced at C6-7. Upper chest: Visualized upper chest demonstrates no acute finding. Partially visualized lungs  are clear. Other: None. IMPRESSION: 1. 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 Finding is highly concerning for underlying malignancy, with a primary nasopharyngeal carcinoma being the primary differential consideration. This lesion is almost certainly highly aggressive in nature as these changes appear largely new as compared to recent temporal bone CT from 09/07/2019. ENT referral for further workup and consultation recommended. 2. Asymmetric prominence of right-sided cervical lymph nodes as compared to the left, nonspecific and may be reactive. Possible nodal metastases not excluded. 3. Asymmetric hyperdensity and irregularity involving the right parotid gland, which could be related to direct tumor invasion or possibly associated/concomitant parotitis. Correlation with symptomatology recommended. 4. Aortic Atherosclerosis (ICD10-I70.0). Electronically Signed   By: Rise Mu M.D.   On: 03/13/2020 21:51   DG Chest Portable 1 View  Result Date: 03/13/2020 CLINICAL DATA:  74 year old male with altered mental status. EXAM: PORTABLE CHEST 1 VIEW COMPARISON:  None. FINDINGS: The lungs are clear. There is no pleural effusion pneumothorax. The cardiac silhouette is within limits. Atherosclerotic calcification of the aortic arch. No acute osseous pathology. IMPRESSION: No active disease. Electronically Signed   By: Elgie Collard M.D.   On: 03/13/2020 19:17      IMPRESSION AND PLAN:   1.  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 -The patient will be admitted to a medical monitored bed. -We will continue antibiotic therapy with IV Unasyn and to cover Pseudomonas with his purulent right ear exudate we will add  p.o. Cipro, especially given elevated lactic acid. -An ENT consultation by Dr. Jenne Campus was ordered. -The case was discussed with him. -An oncology consultation will be obtained. -I notified Dr. Donneta Romberg.  2.  Right pleural otitis externa, possibly acute on chronic. -ENT consult will be obtained as mentioned above. -P.o. Cipro was added to IV Unasyn.  3.  Type 2 diabetes mellitus. -The patient will be placed on supplement coverage with NovoLog and will continue his home diabetic regimen.  4.  Hypertension. -We will continue Norvasc and Zestril.  5.  DVT prophylaxis. -Subcutaneous Lovenox.   All the records are reviewed and case discussed with ED provider. The plan of care was discussed in details with the patient (and family). I answered all questions. The patient agreed to proceed with the above mentioned plan. Further management will depend upon hospital course.   CODE STATUS: Full code  Status is: Inpatient  Remains inpatient appropriate because:Ongoing diagnostic testing needed not appropriate for outpatient work up, Unsafe d/c plan, IV treatments appropriate due to intensity of illness or inability to take PO and Inpatient level of care appropriate due to severity of illness   Dispo: The patient is from: Home              Anticipated d/c is to: Home              Anticipated d/c date is: 2 days              Patient currently is not  medically stable to d/c.   TOTAL TIME TAKING CARE OF THIS PATIENT: 55 minutes.    Hannah Beat M.D on 03/13/2020 at 11:42 PM  [facility]   From 7 PM-7 AM, contact night-coverage [email or web address]  CC: Primary care physician; System, Provider Not In   Note: This dictation was prepared with Dragon dictation along with smaller phrase technology. Any transcriptional typo errors that result from this process are unintentional.

## 2020-03-13 NOTE — ED Notes (Signed)
Patient transported to CT at this time. 

## 2020-03-13 NOTE — ED Triage Notes (Signed)
Pt coming from home. Per EMS pt's wife called EMS due to pt being less responsive while sitting in the chair. Pt stating he usually starts to feel funny if his CBG is below 130. BFD CBG. Pt also with surgery to right ear in February t remove polyps. Since pt has noticed swelling to right side of face and drainage from ear.   Upon arrival pt in NAD. Pt c/o dizziness. Pt A&Ox4. 20gLFA. VSS. Afebrile.

## 2020-03-13 NOTE — ED Notes (Signed)
Pt given meal tray and diet sprite 

## 2020-03-13 NOTE — ED Provider Notes (Signed)
[facility] Emergency Department Provider Note    First MD Initiated Contact with Patient 03/13/20 1851     (approximate)  I have reviewed the triage vital signs and the nursing notes.   HISTORY  Chief Complaint Dizziness    HPI Daniel KillianRobert Sanders is a 74 y.o. male with the below listed past medical history presents to the ER for evaluation of confusion generalized weakness.  No complaint of any chest pain or shortness of breath.  Does report that he had a recent ear surgery within the past year and has noticed increasing drainage from the right ear.  States is also having swelling of the right side of his face.  Denies any pain at this time.  No trouble swallowing.  No numbness or tingling.    Past Medical History:  Diagnosis Date  . Arthritis   . Diabetes mellitus without complication (HCC)   . Hyperlipidemia associated with type 2 diabetes mellitus (HCC)   . Hypertension   . Obesity   . Sleep apnea    per patient, incorrect  . Venous insufficiency of both lower extremities 2021   History reviewed. No pertinent family history. Past Surgical History:  Procedure Laterality Date  . MASS EXCISION Right 10/11/2019   Procedure: EXCISION EAR CANAL MASS;  Surgeon: Linus SalmonsMcQueen, Chapman, MD;  Location: [facility] ORS;  Service: ENT;  Laterality: Right;   Patient Active Problem List   Diagnosis Date Noted  . Weakness 03/13/2020      Prior to Admission medications   Medication Sig Start Date End Date Taking? Authorizing Provider  acetaminophen (TYLENOL) 500 MG tablet Take 500 mg by mouth every 6 (six) hours as needed.    [provider]  amLODipine (NORVASC) 5 MG tablet Take 5 mg by mouth daily. 08/26/19   [provider]  aspirin EC 81 MG tablet Take 81 mg by mouth daily.    [provider]  atorvastatin (LIPITOR) 20 MG tablet Take 20 mg by mouth at bedtime. 05/24/19   [provider]  furosemide (LASIX) 40 MG tablet Take 40 mg by  mouth every evening. 05/23/19   [provider]  HYDROcodone-Acetaminophen 5-300 MG TABS Take 1-2 tablets by mouth every 4 (four) hours as needed. 10/11/19   Linus SalmonsMcQueen, Chapman, MD  lisinopril (ZESTRIL) 40 MG tablet Take 40 mg by mouth 2 (two) times daily. 06/30/19   [provider]  metFORMIN (GLUCOPHAGE) 1000 MG tablet Take 1,000 mg by mouth 2 (two) times daily. 06/30/19   [provider]  Multiple Vitamin (MULTIVITAMIN WITH MINERALS) TABS tablet Take 1 tablet by mouth daily. Men's 50+    [provider]  NOVOLIN N 100 UNIT/ML injection Inject 60-70 Units into the skin See admin instructions. Inject 70 units subcutaneously in the morning & inject 60 units subcutaneously in the evening 08/16/19   [provider]  NOVOLIN R 100 UNIT/ML injection Inject 20-25 Units into the skin See admin instructions. Inject 25 units subcutaneously in the morning & inject 20 units subcutaneously in the evening 08/15/19   [provider]  vitamin B-12 (CYANOCOBALAMIN) 1000 MCG tablet Take 1,000 mcg by mouth daily.    [provider]    Allergies Patient has no known allergies.    Social History Social History   Tobacco Use  . Smoking status: Former Smoker    Types: Cigarettes, Pipe  . 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 Vaping Use: Never used  Substance Use Topics  . Alcohol use: Never    Comment: nothing for 36 years  . Drug use: Not on file    Comment: nothing in over 30 years    Review of Systems Patient denies headaches, rhinorrhea, blurry vision, numbness, shortness of breath, chest pain, edema, cough, abdominal pain, nausea, vomiting, diarrhea, dysuria, fevers, rashes or hallucinations unless otherwise stated above in HPI. ____________________________________________   PHYSICAL EXAM:  VITAL SIGNS: Vitals:   03/13/20 1854  BP: 122/68  Pulse: 91  Resp: 16  Temp: 98 F (36.7 C)  SpO2: 99%     Constitutional: Alert  Eyes: Conjunctivae are normal.  Head: Atraumatic. Nose: No congestion/rhinnorhea. Mouth/Throat: Mucous membranes are moist.  Right ear with complete opacification and purulent drainage Neck: No stridor. Painless ROM.  Cardiovascular: Normal rate, regular rhythm. Grossly normal heart sounds.  Good peripheral circulation. Respiratory: Normal respiratory effort.  No retractions. Lungs CTAB. Gastrointestinal: Soft and nontender. No distention. No abdominal bruits. No CVA tenderness. Genitourinary:  Musculoskeletal: No lower extremity tenderness nor edema.  No joint effusions. Neurologic:  Normal speech and language. No gross focal neurologic deficits are appreciated. No facial droop Skin:  Skin is warm, dry and intact. No rash noted. Psychiatric: Mood and affect are normal. Speech and behavior are normal.  ____________________________________________   LABS (all labs ordered are listed, but only abnormal results are displayed)  Results for orders placed or performed during the hospital encounter of 03/13/20 (from the past 24 hour(s))  CBC with Differential/Platelet     Status: Abnormal   Collection Time: 03/13/20  6:55 PM  Result Value Ref Range   WBC 8.9 4.0 - 10.5 K/uL   RBC 4.15 (L) 4.22 - 5.81 MIL/uL   Hemoglobin 11.3 (L) 13.0 - 17.0 g/dL   HCT 73.4 (L) 39 - 52 %   MCV 85.5 80.0 - 100.0 fL   MCH 27.2 26.0 - 34.0 pg   MCHC 31.8 30.0 - 36.0 g/dL   RDW 19.3 79.0 - 24.0 %   Platelets 350 150 - 400 K/uL   nRBC 0.0 0.0 - 0.2 %   Neutrophils Relative % 41 %   Neutro Abs 3.6 1.7 - 7.7 K/uL   Lymphocytes Relative 48 %   Lymphs Abs 4.2 (H) 0.7 - 4.0 K/uL   Monocytes Relative 9 %   Monocytes Absolute 0.8 0 - 1 K/uL   Eosinophils Relative 1 %   Eosinophils Absolute 0.1 0 - 0 K/uL   Basophils Relative 1 %   Basophils Absolute 0.1 0 - 0 K/uL   Immature Granulocytes 0 %   Abs Immature Granulocytes 0.04 0.00 - 0.07 K/uL  Comprehensive metabolic panel      Status: Abnormal   Collection Time: 03/13/20  6:55 PM  Result Value Ref Range   Sodium 133 (L) 135 - 145 mmol/L   Potassium 4.5 3.5 - 5.1 mmol/L   Chloride 99 98 - 111 mmol/L   CO2 23 22 - 32 mmol/L   Glucose, Bld 123 (H) 70 - 99 mg/dL   BUN 12 8 - 23 mg/dL   Creatinine, Ser 9.73 0.61 - 1.24 mg/dL   Calcium 8.5 (L) 8.9 - 10.3 mg/dL   Total Protein 7.6 6.5 - 8.1 g/dL   Albumin 2.8 (L) 3.5 - 5.0 g/dL   AST 21 15 - 41 U/L   ALT 21 0 - 44 U/L   Alkaline Phosphatase 80 38 - 126 U/L   Total Bilirubin 0.7 0.3 - 1.2 mg/dL  GFR calc non Af Amer >60 >60 mL/min   GFR calc Af Amer >60 >60 mL/min   Anion gap 11 5 - 15  Lactic acid, plasma     Status: Abnormal   Collection Time: 03/13/20  6:55 PM  Result Value Ref Range   Lactic Acid, Venous 2.7 (HH) 0.5 - 1.9 mmol/L  Troponin I (High Sensitivity)     Status: None   Collection Time: 03/13/20  6:55 PM  Result Value Ref Range   Troponin I (High Sensitivity) 9 <18 ng/L  Troponin I (High Sensitivity)     Status: None   Collection Time: 03/13/20  9:08 PM  Result Value Ref Range   Troponin I (High Sensitivity) 7 <18 ng/L  Lactic acid, plasma     Status: Abnormal   Collection Time: 03/13/20  9:08 PM  Result Value Ref Range   Lactic Acid, Venous 2.0 (HH) 0.5 - 1.9 mmol/L   ____________________________________________  EKG My review and personal interpretation at Time: 18:44   Indication: sinus  Rate: 90  Rhythm: sinus Axis: normal Other: normal intervals, no stemi ____________________________________________  RADIOLOGY  I personally reviewed all radiographic images ordered to evaluate for the above acute complaints and reviewed radiology reports and findings.  These findings were personally discussed with the patient.  Please see medical record for radiology report.  ____________________________________________   PROCEDURES  Procedure(s) performed:  Procedures    Critical Care performed:  o ____________________________________________   INITIAL IMPRESSION / ASSESSMENT AND PLAN / ED COURSE  Pertinent labs & imaging results that were available during my care of the patient were reviewed by me and considered in my medical decision making (see chart for details).   DDX: Dehydration, sepsis, pna, uti, hypoglycemia, cva, drug effect, withdrawal, abscess, mastoiditis   Daniel Sanders is a 74 y.o. who presents to the ED with symptoms as described above.  Patient is nontoxic-appearing does have some chronic comorbidities.  Does have significant mount of purulent drainage come from the right ear does have some pain associated at that location concern for possible mastoiditis or deep space infection.  Does not have any focal neuro deficits.  No meningismus.  Does appear weak and ill.  Reports decreased PO intake.  Will check blood work give some IV fluids order imaging for the but differential.  Clinical Course as of Mar 13 2250  Tue Mar 13, 2020  1942 Called by radiology given concerning findings on CT which would suggest soft tissue mass.  Recommending CT soft tissue with contrast.   [PR]  2228 I consulted with Dr. Jenne Campus of ear nose and throat surgery regarding the findings.  Has recommended IV Decadron in addition antibiotics and send for EBV as well but would not anticipate emergent surgical intervention at this time.  As he is feeling weak has decreased p.o. intake with elevated lactate and concern for infection will discuss with hospitalist for observation IV fluids and reassessment.   [PR]    Clinical Course User Index [PR] Willy Eddy, MD    The patient was evaluated in Emergency Department today for the symptoms described in the history of present illness. He/she was evaluated in the context of the global COVID-19 pandemic, which necessitated consideration that the patient might be at risk for infection with the SARS-CoV-2 virus that causes COVID-19. Institutional  protocols and algorithms that pertain to the evaluation of patients at risk for COVID-19 are in a state of rapid change based on information released by regulatory bodies including the  CDC and federal and [employer]. These policies and algorithms were followed during the patient's care in the ED.  As part of my medical decision making, I reviewed the following data within the electronic MEDICAL RECORD NUMBER Nursing notes reviewed and incorporated, Labs reviewed, notes from prior ED visits and [employer] Controlled Substance Database   ____________________________________________   FINAL CLINICAL IMPRESSION(S) / ED DIAGNOSES  Final diagnoses:  Lightheadedness  Dehydration  Weakness      NEW MEDICATIONS STARTED DURING THIS VISIT:  New Prescriptions   No medications on file     Note:  This document was prepared using Dragon voice recognition software and may include unintentional dictation errors.    Willy Eddy, MD 03/13/20 2251

## 2020-03-14 ENCOUNTER — Inpatient Hospital Stay: Payer: Medicare Other

## 2020-03-14 ENCOUNTER — Telehealth: Payer: Self-pay | Admitting: Internal Medicine

## 2020-03-14 DIAGNOSIS — M8618 Other acute osteomyelitis, other site: Secondary | ICD-10-CM

## 2020-03-14 LAB — BASIC METABOLIC PANEL
Anion gap: 9 (ref 5–15)
BUN: 12 mg/dL (ref 8–23)
CO2: 26 mmol/L (ref 22–32)
Calcium: 8.2 mg/dL — ABNORMAL LOW (ref 8.9–10.3)
Chloride: 97 mmol/L — ABNORMAL LOW (ref 98–111)
Creatinine, Ser: 0.6 mg/dL — ABNORMAL LOW (ref 0.61–1.24)
GFR calc Af Amer: >60 mL/min (ref >60)
GFR calc non Af Amer: >60 mL/min (ref >60)
Glucose, Bld: 324 mg/dL — ABNORMAL HIGH (ref 70–99)
Potassium: 4.7 mmol/L (ref 3.5–5.1)
Sodium: 132 mmol/L — ABNORMAL LOW (ref 135–145)

## 2020-03-14 LAB — GLUCOSE, CAPILLARY
Glucose-Capillary: 292 mg/dL — ABNORMAL HIGH (ref 70–99)
Glucose-Capillary: 250 mg/dL — ABNORMAL HIGH (ref 70–99)
Glucose-Capillary: 294 mg/dL — ABNORMAL HIGH (ref 70–99)
Glucose-Capillary: 298 mg/dL — ABNORMAL HIGH (ref 70–99)

## 2020-03-14 LAB — CBC
HCT: 35.4 % — ABNORMAL LOW (ref 39.0–52.0)
Hemoglobin: 11.2 g/dL — ABNORMAL LOW (ref 13.0–17.0)
MCH: 27 pg (ref 26.0–34.0)
MCHC: 31.6 g/dL (ref 30.0–36.0)
MCV: 85.3 fL (ref 80.0–100.0)
Platelets: 334 10*3/uL (ref 150–400)
RBC: 4.15 MIL/uL — ABNORMAL LOW (ref 4.22–5.81)
RDW: 14.1 % (ref 11.5–15.5)
WBC: 5.6 10*3/uL (ref 4.0–10.5)
nRBC: 0 % (ref 0.0–0.2)

## 2020-03-14 LAB — SARS CORONAVIRUS 2 BY RT PCR (HOSPITAL ORDER, PERFORMED IN ~~LOC~~ HOSPITAL LAB): SARS Coronavirus 2: NEGATIVE

## 2020-03-14 MED ORDER — INSULIN ASPART PROT & ASPART (70-30 MIX) 100 UNIT/ML ~~LOC~~ SUSP: 40.0000 [IU] | Freq: Every day | SUBCUTANEOUS | Status: DC

## 2020-03-14 MED ORDER — INSULIN ASPART 100 UNIT/ML ~~LOC~~ SOLN
0.0000 [IU] | Freq: Every day | SUBCUTANEOUS | Status: DC
  Administered 2020-03-14: 3 [IU] via SUBCUTANEOUS
  Administered 2020-03-15: 2 [IU] via SUBCUTANEOUS
  Administered 2020-03-16: 3 [IU] via SUBCUTANEOUS
  Administered 2020-03-17: 2 [IU] via SUBCUTANEOUS
  Administered 2020-03-18: 3 [IU] via SUBCUTANEOUS
  Filled 2020-03-14 (×5): qty 1

## 2020-03-14 MED ORDER — VANCOMYCIN HCL 10 G IV SOLR
2500.0000 mg | Freq: Once | INTRAVENOUS | Status: AC
  Administered 2020-03-14: 2500 mg via INTRAVENOUS
  Filled 2020-03-14: qty 2500

## 2020-03-14 MED ORDER — INSULIN DETEMIR 100 UNIT/ML ~~LOC~~ SOLN
50.0000 [IU] | Freq: Every morning | SUBCUTANEOUS | Status: DC
  Filled 2020-03-14: qty 0.5

## 2020-03-14 MED ORDER — VANCOMYCIN HCL 1500 MG/300ML IV SOLN
1500.0000 mg | Freq: Two times a day (BID) | INTRAVENOUS | Status: DC
  Administered 2020-03-15 – 2020-03-16 (×3): 1500 mg via INTRAVENOUS
  Filled 2020-03-14 (×5): qty 300

## 2020-03-14 MED ORDER — DEXAMETHASONE SODIUM PHOSPHATE 4 MG/ML IJ SOLN
4.0000 mg | Freq: Three times a day (TID) | INTRAMUSCULAR | Status: DC
  Administered 2020-03-15 – 2020-03-16 (×5): 4 mg via INTRAVENOUS
  Filled 2020-03-14 (×6): qty 1

## 2020-03-14 MED ORDER — INSULIN DETEMIR 100 UNIT/ML ~~LOC~~ SOLN
40.0000 [IU] | Freq: Every day | SUBCUTANEOUS | Status: DC
  Filled 2020-03-14: qty 0.4

## 2020-03-14 MED ORDER — INSULIN GLARGINE 100 UNIT/ML ~~LOC~~ SOLN
30.0000 [IU] | Freq: Two times a day (BID) | SUBCUTANEOUS | Status: DC
  Administered 2020-03-14 – 2020-03-16 (×4): 30 [IU] via SUBCUTANEOUS
  Filled 2020-03-14 (×5): qty 0.3

## 2020-03-14 MED ORDER — INSULIN REGULAR HUMAN 100 UNIT/ML IJ SOLN: 20.0000 [IU] | Freq: Every morning | INTRAMUSCULAR | Status: DC

## 2020-03-14 MED ORDER — INSULIN ASPART 100 UNIT/ML ~~LOC~~ SOLN
0.0000 [IU] | Freq: Three times a day (TID) | SUBCUTANEOUS | Status: DC
  Administered 2020-03-15: 7 [IU] via SUBCUTANEOUS
  Administered 2020-03-15 (×2): 11 [IU] via SUBCUTANEOUS
  Administered 2020-03-16: 7 [IU] via SUBCUTANEOUS
  Administered 2020-03-16: 15 [IU] via SUBCUTANEOUS
  Administered 2020-03-16: 7 [IU] via SUBCUTANEOUS
  Administered 2020-03-17: 4 [IU] via SUBCUTANEOUS
  Administered 2020-03-17 (×2): 11 [IU] via SUBCUTANEOUS
  Administered 2020-03-18: 7 [IU] via SUBCUTANEOUS
  Administered 2020-03-18: 4 [IU] via SUBCUTANEOUS
  Administered 2020-03-19: 3 [IU] via SUBCUTANEOUS
  Filled 2020-03-14 (×12): qty 1

## 2020-03-14 MED ORDER — GADOBUTROL 1 MMOL/ML IV SOLN
10.0000 mL | Freq: Once | INTRAVENOUS | Status: AC | PRN
  Administered 2020-03-14: 10 mL via INTRAVENOUS
  Filled 2020-03-14: qty 10

## 2020-03-14 MED ORDER — INSULIN ASPART PROT & ASPART (70-30 MIX) 100 UNIT/ML ~~LOC~~ SUSP
50.0000 [IU] | Freq: Every day | SUBCUTANEOUS | Status: DC
  Administered 2020-03-14: 50 [IU] via SUBCUTANEOUS
  Filled 2020-03-14: qty 10

## 2020-03-14 MED ORDER — INSULIN REGULAR HUMAN 100 UNIT/ML IJ SOLN: 16.0000 [IU] | Freq: Every evening | INTRAMUSCULAR | Status: DC

## 2020-03-14 MED ORDER — ENOXAPARIN SODIUM 40 MG/0.4ML ~~LOC~~ SOLN
40.0000 mg | Freq: Two times a day (BID) | SUBCUTANEOUS | Status: DC
  Administered 2020-03-14 – 2020-03-19 (×11): 40 mg via SUBCUTANEOUS
  Filled 2020-03-14 (×11): qty 0.4

## 2020-03-14 MED ORDER — PIPERACILLIN-TAZOBACTAM 3.375 G IVPB
3.3750 g | Freq: Three times a day (TID) | INTRAVENOUS | Status: DC
  Administered 2020-03-14 – 2020-03-19 (×15): 3.375 g via INTRAVENOUS
  Filled 2020-03-14 (×16): qty 50

## 2020-03-14 NOTE — ED Notes (Signed)
ED TO INPATIENT HANDOFF REPORT  ED Nurse Name and Phone #: [phone]  S Name/Age/Gender Daniel Sanders 74 y.o. male Room/Bed: ED32A/ED32A  Code Status   Code Status: Full Code  Home/SNF/Other Home Patient oriented to: self, place, time and situation Is this baseline? Yes   Triage Complete: Triage complete  Chief Complaint Weakness [R53.1]  Triage Note Pt coming from home. Per EMS pt's wife called EMS due to pt being less responsive while sitting in the chair. Pt stating he usually starts to feel funny if his CBG is below 130. BFD CBG. Pt also with surgery to right ear in February t remove polyps. Since pt has noticed swelling to right side of face and drainage from ear.   Upon arrival pt in NAD. Pt c/o dizziness. Pt A&Ox4. 20gLFA. VSS. Afebrile.      Allergies No Known Allergies  Level of Care/Admitting Diagnosis ED Disposition    ED Disposition Condition Comment   Admit  Hospital Area: [facility] [[id number]]  Level of Care: Med-Surg [16]  Covid Evaluation: Asymptomatic Screening Protocol (No Symptoms)  Diagnosis: Weakness [241835]  Admitting Physician: Hannah Beat [[id number]]  Attending Physician: Hannah Beat [[id number]]  Estimated length of stay: past midnight tomorrow  Certification:: I certify this patient will need inpatient services for at least 2 midnights       B Medical/Surgery History Past Medical History:  Diagnosis Date  . Arthritis   . Diabetes mellitus without complication (HCC)   . Hyperlipidemia associated with type 2 diabetes mellitus (HCC)   . Hypertension   . Obesity   . Sleep apnea    per patient, incorrect  . Venous insufficiency of both lower extremities 2021   Past Surgical History:  Procedure Laterality Date  . MASS EXCISION Right 10/11/2019   Procedure: EXCISION EAR CANAL MASS;  Surgeon: Linus Salmons, MD;  Location: [facility] ORS;  Service: ENT;  Laterality: Right;     A IV Location/Drains/Wounds Patient  Lines/Drains/Airways Status    Active Line/Drains/Airways    Name Placement date Placement time Site Days   Peripheral IV 03/13/20 Anterior;Right Hand 03/13/20  1942  Hand  1   Peripheral IV 03/13/20 Left Forearm 03/13/20  1947  Forearm  1   Incision (Closed) 10/11/19 Ear 10/11/19  0811   155          Intake/Output Last 24 hours  Intake/Output Summary (Last 24 hours) at 03/14/2020 1627 Last data filed at 03/14/2020 0230 Gross per 24 hour  Intake 900 ml  Output --  Net 900 ml    Labs/Imaging Results for orders placed or performed during the hospital encounter of 03/13/20 (from the past 48 hour(s))  CBC with Differential/Platelet     Status: Abnormal   Collection Time: 03/13/20  6:55 PM  Result Value Ref Range   WBC 8.9 4.0 - 10.5 K/uL   RBC 4.15 (L) 4.22 - 5.81 MIL/uL   Hemoglobin 11.3 (L) 13.0 - 17.0 g/dL   HCT 96.2 (L) 39 - 52 %   MCV 85.5 80.0 - 100.0 fL   MCH 27.2 26.0 - 34.0 pg   MCHC 31.8 30.0 - 36.0 g/dL   RDW 95.2 84.1 - 32.4 %   Platelets 350 150 - 400 K/uL   nRBC 0.0 0.0 - 0.2 %   Neutrophils Relative % 41 %   Neutro Abs 3.6 1.7 - 7.7 K/uL   Lymphocytes Relative 48 %   Lymphs Abs 4.2 (H) 0.7 - 4.0 K/uL  Monocytes Relative 9 %   Monocytes Absolute 0.8 0 - 1 K/uL   Eosinophils Relative 1 %   Eosinophils Absolute 0.1 0 - 0 K/uL   Basophils Relative 1 %   Basophils Absolute 0.1 0 - 0 K/uL   Immature Granulocytes 0 %   Abs Immature Granulocytes 0.04 0.00 - 0.07 K/uL    Comment: Performed at [facility], [address] [place], [place] [address]  Comprehensive metabolic panel     Status: Abnormal   Collection Time: 03/13/20  6:55 PM  Result Value Ref Range   Sodium 133 (L) 135 - 145 mmol/L   Potassium 4.5 3.5 - 5.1 mmol/L   Chloride 99 98 - 111 mmol/L   CO2 23 22 - 32 mmol/L   Glucose, Bld 123 (H) 70 - 99 mg/dL    Comment: Glucose reference range applies only to samples taken after fasting for at least 8 hours.   BUN 12 8 - 23 mg/dL    Creatinine, Ser 6.040.78 0.61 - 1.24 mg/dL   Calcium 8.5 (L) 8.9 - 10.3 mg/dL   Total Protein 7.6 6.5 - 8.1 g/dL   Albumin 2.8 (L) 3.5 - 5.0 g/dL   AST 21 15 - 41 U/L   ALT 21 0 - 44 U/L   Alkaline Phosphatase 80 38 - 126 U/L   Total Bilirubin 0.7 0.3 - 1.2 mg/dL   GFR calc non Af Amer >60 >60 mL/min   GFR calc Af Amer >60 >60 mL/min   Anion gap 11 5 - 15    Comment: Performed at [facility], [address] [place], [place] [address]  Lactic acid, plasma     Status: Abnormal   Collection Time: 03/13/20  6:55 PM  Result Value Ref Range   Lactic Acid, Venous 2.7 (HH) 0.5 - 1.9 mmol/L    Comment: CRITICAL RESULT CALLED TO, READ BACK BY AND VERIFIED WITH GRACIE [facility] @1936  03/13/20 MJU Performed at [facility], [address] [place], [place] [address]   Troponin I (High Sensitivity)     Status: None   Collection Time: 03/13/20  6:55 PM  Result Value Ref Range   Troponin I (High Sensitivity) 9 <18 ng/L    Comment: (NOTE) Elevated high sensitivity troponin I (hsTnI) values and significant  changes across serial measurements may suggest ACS but many other  chronic and acute conditions are known to elevate hsTnI results.  Refer to the "Links" section for chest pain algorithms and additional  guidance. Performed at [facility], [address] [place], [place] [address]   Blood culture (routine x 2)     Status: None (Preliminary result)   Collection Time: 03/13/20  7:37 PM   Specimen: BLOOD  Result Value Ref Range   Specimen Description BLOOD BLOOD RIGHT WRIST    Special Requests      BOTTLES DRAWN AEROBIC AND ANAEROBIC Blood Culture results may not be optimal due to an inadequate volume of blood received in culture bottles   Culture      NO GROWTH < 12 HOURS Performed at [facility], [address] [place], [place] [address]    Report Status PENDING   Blood culture (routine x 2)     Status: None (Preliminary result)   Collection Time:  03/13/20  7:42 PM   Specimen: BLOOD  Result Value Ref Range   Specimen Description BLOOD LEFT ANTECUBITAL    Special Requests      BOTTLES DRAWN AEROBIC  AND ANAEROBIC Blood Culture results may not be optimal due to an inadequate volume of blood received in culture bottles   Culture      NO GROWTH < 12 HOURS Performed at [facility], [address] [place], [place] [address]    Report Status PENDING   Troponin I (High Sensitivity)     Status: None   Collection Time: 03/13/20  9:08 PM  Result Value Ref Range   Troponin I (High Sensitivity) 7 <18 ng/L    Comment: (NOTE) Elevated high sensitivity troponin I (hsTnI) values and significant  changes across serial measurements may suggest ACS but many other  chronic and acute conditions are known to elevate hsTnI results.  Refer to the "Links" section for chest pain algorithms and additional  guidance. Performed at [facility], [address] [place], [place] [address]   Lactic acid, plasma     Status: Abnormal   Collection Time: 03/13/20  9:08 PM  Result Value Ref Range   Lactic Acid, Venous 2.0 (HH) 0.5 - 1.9 mmol/L    Comment: CRITICAL VALUE NOTED. VALUE IS CONSISTENT WITH PREVIOUSLY REPORTED/CALLED VALUE RH Performed at [facility], [address] [place], [place] [address]   Glucose, capillary     Status: Abnormal   Collection Time: 03/13/20 10:32 PM  Result Value Ref Range   Glucose-Capillary 102 (H) 70 - 99 mg/dL    Comment: Glucose reference range applies only to samples taken after fasting for at least 8 hours.  SARS Coronavirus 2 by RT PCR (hospital order, performed in [facility] hospital lab) Nasopharyngeal Nasopharyngeal Swab     Status: None   Collection Time: 03/13/20 11:05 PM   Specimen: Nasopharyngeal Swab  Result Value Ref Range   SARS Coronavirus 2 NEGATIVE NEGATIVE    Comment: (NOTE) SARS-CoV-2 target nucleic acids are NOT DETECTED.  The SARS-CoV-2 RNA is generally  detectable in upper and lower respiratory specimens during the acute phase of infection. The lowest concentration of SARS-CoV-2 viral copies this assay can detect is 250 copies / mL. A negative result does not preclude SARS-CoV-2 infection and should not be used as the sole basis for treatment or other patient management decisions.  A negative result may occur with improper specimen collection / handling, submission of specimen other than nasopharyngeal swab, presence of viral mutation(s) within the areas targeted by this assay, and inadequate number of viral copies (<250 copies / mL). A negative result must be combined with clinical observations, patient history, and epidemiological information.  Fact Sheet for Patients:   BoilerBrush.com.cy  Fact Sheet for Healthcare Providers: https://pope.com/  This test is not yet approved or  cleared by the [place] FDA and has been authorized for detection and/or diagnosis of SARS-CoV-2 by FDA under an Emergency Use Authorization (EUA).  This EUA will remain in effect (meaning this test can be used) for the duration of the COVID-19 declaration under Section 564(b)(1) of the Act, 21 U.S.C. section 360bbb-3(b)(1), unless the authorization is terminated or revoked sooner.  Performed at [facility], [address] [place], [place] [address]   Basic metabolic panel     Status: Abnormal   Collection Time: 03/14/20  4:57 AM  Result Value Ref Range   Sodium 132 (L) 135 - 145 mmol/L   Potassium 4.7 3.5 - 5.1 mmol/L   Chloride 97 (L) 98 - 111 mmol/L   CO2 26 22 - 32 mmol/L   Glucose, Bld 324 (H) 70 - 99 mg/dL  Comment: Glucose reference range applies only to samples taken after fasting for at least 8 hours.   BUN 12 8 - 23 mg/dL   Creatinine, Ser 1.61 (L) 0.61 - 1.24 mg/dL   Calcium 8.2 (L) 8.9 - 10.3 mg/dL   GFR calc non Af Amer >60 >60 mL/min   GFR calc Af Amer >60 >60 mL/min    Anion gap 9 5 - 15    Comment: Performed at [facility], [address] [place], [place] [address]  CBC     Status: Abnormal   Collection Time: 03/14/20  4:57 AM  Result Value Ref Range   WBC 5.6 4.0 - 10.5 K/uL   RBC 4.15 (L) 4.22 - 5.81 MIL/uL   Hemoglobin 11.2 (L) 13.0 - 17.0 g/dL   HCT 54.0 (L) 39 - 52 %   MCV 85.3 80.0 - 100.0 fL   MCH 27.0 26.0 - 34.0 pg   MCHC 31.6 30.0 - 36.0 g/dL   RDW 98.1 19.1 - 47.8 %   Platelets 334 150 - 400 K/uL   nRBC 0.0 0.0 - 0.2 %    Comment: Performed at [facility], [address] [place], [place] [address]  Aerobic/Anaerobic Culture (surgical/deep wound)     Status: None (Preliminary result)   Collection Time: 03/14/20  8:17 AM   Specimen: Ear  Result Value Ref Range   Specimen Description      EAR RIGHT Performed at [facility], [address], [place], [place] [address]    Special Requests      NONE Performed at [facility], [address] [place], [place] [address]    Gram Stain      RARE WBC PRESENT,BOTH PMN AND MONONUCLEAR NO ORGANISMS SEEN Performed at [facility], [address], [place], [place] [address]    Culture PENDING    Report Status PENDING   Glucose, capillary     Status: Abnormal   Collection Time: 03/14/20  9:28 AM  Result Value Ref Range   Glucose-Capillary 292 (H) 70 - 99 mg/dL    Comment: Glucose reference range applies only to samples taken after fasting for at least 8 hours.   CT Head Wo Contrast  Result Date: 03/13/2020 CLINICAL DATA:  Altered mental status EXAM: CT HEAD WITHOUT CONTRAST TECHNIQUE: Contiguous axial images were obtained from the base of the skull through the vertex without intravenous contrast. COMPARISON:  09/07/2019 FINDINGS: Brain: Mild atrophic changes are noted. No findings to suggest acute hemorrhage, acute infarction or space-occupying mass lesion is seen. There is a chronic almost isodense collection in the subdural space on the  right with minimal mass effect and maximum thickness of approximately 6 mm. No midline shift is seen. 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 Vascular: No hyperdense vessel or unexpected calcification. Skull: Normal. Negative for fracture or focal lesion. Sinuses/Orbits: The paranasal sinuses are within normal limits. The mastoid air cells on the left are unremarkable. Fluid attenuation is noted throughout the mastoid air cells on the right as well as within the external auditory canal and middle ear. Other: In the right posterior nasopharynx, there is a 3.1 x 2.0 cm mass lesion identified which is new from the prior exam. This likely occludes the eustachian tube on the right causing the changes in the right middle ear. Additionally there is enlargement of the temporalis and masseter muscles surrounding the right mandible. Soft tissue density is noted on the right  adjacent to the right ear as well. IMPRESSION: No acute intracranial abnormality is noted. Mild atrophic changes are seen. 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 No acute component is noted. New soft tissue mass lesion in the posterior nasopharynx on the right with opacification of the mastoid air cells on the right as well as the right middle ear and external auditory canal. Surrounding musculature enlargement is noted which may represent localized invasion. 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 Critical Value/emergent results were called by telephone at the time of interpretation on 03/13/2020 at 7:36 pm to Dr. Willy Eddy , who verbally acknowledged these results. Electronically Signed   By: Alcide Clever M.D.   On: 03/13/2020 19:40   CT Soft Tissue Neck W Contrast  Result Date: 03/13/2020 CLINICAL DATA:  Initial evaluation for neck mass. EXAM:  CT NECK WITH CONTRAST TECHNIQUE: Multidetector CT imaging of the neck was performed using the standard protocol following the bolus administration of intravenous contrast. CONTRAST:  31mL OMNIPAQUE IOHEXOL 300 MG/ML  SOLN COMPARISON:  Comparison made with prior head CT from earlier the same day as well as prior temporal bone CT from 09/07/2019. FINDINGS: Pharynx and larynx: Oral cavity within normal limits without discrete mass or collection. No acute abnormality about the remaining dentition. No base of tongue lesion. Palatine tonsils fairly symmetric and within normal limits. There is an ill-defined infiltrative mass centered at the right fossa of Rosenmuller at the right nasopharynx, corresponding with abnormality seen on prior head CT. Although exact measurements are difficult given the infiltrative nature of the lesion, this measures approximately 4.7 x 3.4 x 4.5 cm (AP by transverse by craniocaudad). Associated invasion of the adjacent skull base with erosive changes of the adjacent right aspect of the clivus as well as the right occipital condyle. Probable involvement of the right carotid canal as well as the right jugular foramen. Right carotid space is involved just inferior to the skull base with abnormal soft tissue density surrounding the distal cervical right ICA. Extension into and erosive changes about the right hypoglossal canal. Lateral extension into the right infratemporal fossa with loss of normal fat planes throughout the pterygoid musculature (series 2, image 19). Loss of normal fat plane with the adjacent anterior aspect of the right parotid gland, likely involved (series 2, image 30). Involvement of the right temporomandibular joint with erosive changes along the posterior right zygomatic arch, right mandibular condyle, and right EAC. The right EAC is completely opacified. Complete opacification of the right middle ear cavity could reflect postobstructive fluid or possibly tumor. Involvement  of the inferior right mastoid air cells as well with associated osseous erosion (series 4, image 23). Associated right mastoid effusion, likely postobstructive for the most part. Further lateral extension with asymmetric enlargement of the right temporalis muscle as well as the right pre and postauricular soft tissues. Soft tissue fullness seen at the right auricle and tragus. Finding highly concerning for an underlying malignancy, almost certainly aggressive in nature as these changes appear largely new as compared to prior temporal bone CT from 09/07/2019. Remainder of the hypopharynx and supraglottic larynx within normal limits. No retropharyngeal collection. Epiglottis normal. Vallecula clear. True cords symmetric and normal. Subglottic airway clear. Salivary glands: Asymmetric hyperdensity and irregularity seen involving the right parotid gland, which could reflect associated and/or concomitant acute parotitis. No obstructive stone or abscess. Remainder of the  salivary glands including the left parotid gland and submandibular glands are within normal limits. Thyroid: Thyroid within normal limits. Lymph nodes: Asymmetric prominence of right-sided cervical lymph nodes seen as compared to the left, with the largest nodes seen at right level II measuring 10 mm in short axis (series 2, image 56). Scattered prominent posterior chain nodes noted on the right as well, largest of which measures 5 mm. No associated necrosis. While these nodes are not technically enlarged by size criteria, the asymmetric prominence and appearance of these nodes raises the possibility for possible nodal metastases. Vascular: Normal intravascular enhancement is seen throughout the neck. In case mint of the distal cervical right ICA by the adjacent skull base tumor again noted. Similarly, the right internal jugular vein is compressed and not visualized at the skull base related to adjacent tumor, although appears patent above and below this  level. Moderate atherosclerotic change noted about the aortic arch and carotid siphons. Limited intracranial: Visualized intracranial contents demonstrate no acute finding. No frank invasion of tumor into the intracranial cavity is visible, although this would be better assessed by MRI. Visualized orbits: Globes and orbital soft tissues within normal limits. Mastoids and visualized paranasal sinuses: Mild mucosal thickening noted within the right sphenoid and maxillary sinuses as well as the right ethmoidal air cells. Visualized paranasal sinuses are otherwise clear. Complete opacification of the right middle ear cavity and right mastoid air cells again noted, likely postobstructive for the most part, although direct tumor invasion not excluded. Left mastoid air cells and middle ear cavity remain clear. Skeleton: No other acute osseous abnormality identified. No other discrete or worrisome osseous lesions. Mild-to-moderate multilevel cervical spondylosis, most pronounced at C6-7. Upper chest: Visualized upper chest demonstrates no acute finding. Partially visualized lungs are clear. Other: None. IMPRESSION: 1. 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 Finding is highly concerning for underlying malignancy, with a primary nasopharyngeal carcinoma being the primary differential consideration. This lesion is almost certainly highly aggressive in nature as these changes appear largely new as compared to recent temporal bone CT from 09/07/2019. ENT referral for further workup and consultation recommended. 2. Asymmetric prominence of right-sided cervical lymph nodes as compared to the left, nonspecific and may be reactive. Possible nodal metastases not excluded.  3. Asymmetric hyperdensity and irregularity involving the right parotid gland, which could be related to direct tumor invasion or possibly associated/concomitant parotitis. Correlation with symptomatology recommended. 4. Aortic Atherosclerosis (ICD10-I70.0). Electronically Signed   By: Rise Mu M.D.   On: 03/13/2020 21:51   DG Chest Portable 1 View  Result Date: 03/13/2020 CLINICAL DATA:  74 year old male with altered mental status. EXAM: PORTABLE CHEST 1 VIEW COMPARISON:  None. FINDINGS: The lungs are clear. There is no pleural effusion pneumothorax. The cardiac silhouette is within limits. Atherosclerotic calcification of the aortic arch. No acute osseous pathology. IMPRESSION: No active disease. Electronically Signed   By: Elgie Collard M.D.   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 CONTRAST:  10mL  GADAVIST GADOBUTROL 1 MMOL/ML IV SOLN COMPARISON:  Correlation made with prior CT imaging FINDINGS: Brain: Inner ear structures demonstrate an unremarkable MR appearance. There is no abnormal enhancement within the internal auditory canals. No acute infarction or intracranial hemorrhage. There is no mass effect or edema. Prominence of the ventricles and sulci reflects generalized parenchymal volume loss. Patchy foci of T2 hyperintensity in the supratentorial white matter nonspecific but may reflect mild to moderate chronic microvascular ischemic changes. There is a thin extra-axial collection along the right cerebral convexity measuring up to 6 mm in thickness. This was hypoattenuating on the prior head CT. There is corresponding T1 hyperintensity and susceptibility along the  temporal convexity, which may reflect more blood products. Vascular: Major vessel flow voids at the skull base are preserved. Skull and upper cervical spine: See below. Sinuses/Orbits: Paranasal sinuses are clear. The orbits are unremarkable. Other: Right mastoid and middle ear opacification. Suggestion of peripheral enhancement within the mastoid. Right external auditory canal is opacified. There is abnormal signal and enhancement superficial to the right mastoid extending anteriorly into the temporal scalp and masticator, carotid, and parapharyngeal spaces at the level of the nasopharynx. Small volume fluid superficial to the mastoid demonstrates peripheral enhancement. See saved images series 17. There is also involvement of the right posterolateral nasopharyngeal wall. Right ICA flow void is preserved in this region. Diminished nondominant right vertebral artery flow void in this region may reflect slow flow. There is abnormal marrow signal and enhancement at the skull base involving the clivus and occipital condyle on the right and likely involving the right jugular and hypoglossal foramina. There is abnormal signal of the included right mandibular condyle, which appears eroded. Additional erosive changes are better seen CT. Right sigmoid sinus is patent. IMPRESSION: Similar findings to prior neck CT detailed above involving right mastoid and middle ear, masticator/carotid/parapharyngeal spaces, skull base, and right mandibular condyle. Although malignancy is a consideration, severe infection in the setting of a diabetic patient is favored. Peripheral enhancement within the mastoid as well as about small volume fluid superficial to the mastoid may reflect abscess formation. Thin right cerebral convexity subdural hematoma without significant mass effect. Suspected more subacute blood products along the right temporal convexity; empyema is considered less likely given presence of corresponding susceptibility  which reflects blood products. Electronically Signed   By: Guadlupe Spanish M.D.   On: 03/14/2020 13:51    Pending Labs Unresulted Labs (From admission, onward) Comment          Start     Ordered   03/15/20 0500  Creatinine, serum  Tomorrow morning,   STAT        03/14/20 1627   03/13/20 2343  EPSTEIN-BARR VIRUS (EBV) Antibody Profile  Add-on,   AD        03/13/20 2342   03/13/20 2337  Body fluid culture  Once,   STAT       Question:  Are there also cytology or pathology orders on this specimen?  Answer:  No   03/13/20 2341   03/13/20 2231  Epstein barr vrs(ebv dna by pcr)  Once,   STAT        03/13/20 2230          Vitals/Pain Today's Vitals   03/14/20 0629 03/14/20 0928 03/14/20 0930 03/14/20 0933  BP: 126/70 (!) 142/82 116/69 (!) 118/60  Pulse: 78 71 82 72  Resp: 18 12 (!) 26 13  Temp:      TempSrc:  SpO2: 98% 96% 94% 99%  Weight:      Height:      PainSc:        Isolation Precautions No active isolations  Medications Medications  aspirin EC tablet 81 mg (81 mg Oral Given 03/14/20 0937)  HYDROcodone-acetaminophen (NORCO/VICODIN) 5-325 MG per tablet 1-2 tablet (has no administration in time range)  amLODipine (NORVASC) tablet 5 mg (5 mg Oral Given 03/14/20 0943)  atorvastatin (LIPITOR) tablet 20 mg (20 mg Oral Given 03/14/20 0050)  furosemide (LASIX) tablet 40 mg (has no administration in time range)  lisinopril (ZESTRIL) tablet 40 mg (40 mg Oral Given 03/14/20 0943)  vitamin B-12 (CYANOCOBALAMIN) tablet 1,000 mcg (1,000 mcg Oral Given 03/14/20 0945)  acetaminophen (TYLENOL) tablet 650 mg (has no administration in time range)    Or  acetaminophen (TYLENOL) suppository 650 mg (has no administration in time range)  traZODone (DESYREL) tablet 25 mg (has no administration in time range)  magnesium hydroxide (MILK OF MAGNESIA) suspension 30 mL (has no administration in time range)  ondansetron (ZOFRAN) tablet 4 mg (has no administration in time range)    Or   ondansetron (ZOFRAN) injection 4 mg (has no administration in time range)  morphine 2 MG/ML injection 2 mg (has no administration in time range)  enoxaparin (LOVENOX) injection 40 mg (40 mg Subcutaneous Given 03/14/20 1304)  insulin aspart protamine- aspart (NOVOLOG MIX 70/30) injection 50 Units (50 Units Subcutaneous Given 03/14/20 0943)    And  insulin aspart protamine- aspart (NOVOLOG MIX 70/30) injection 40 Units (has no administration in time range)  piperacillin-tazobactam (ZOSYN) IVPB 3.375 g (3.375 g Intravenous New Bag/Given 03/14/20 1304)  vancomycin (VANCOREADY) IVPB 1500 mg/300 mL (has no administration in time range)  sodium chloride 0.9 % bolus 500 mL (0 mLs Intravenous Stopped 03/13/20 2037)  cefTRIAXone (ROCEPHIN) 1 g in sodium chloride 0.9 % 100 mL IVPB (0 g Intravenous Stopped 03/13/20 2037)  iohexol (OMNIPAQUE) 300 MG/ML solution 75 mL (75 mLs Intravenous Contrast Given 03/13/20 2042)  0.9 %  sodium chloride infusion ( Intravenous Stopped 03/14/20 0051)  dexamethasone (DECADRON) injection 10 mg (10 mg Intravenous Given 03/13/20 2224)  vancomycin (VANCOCIN) 2,500 mg in sodium chloride 0.9 % 500 mL IVPB (0 mg Intravenous Stopped 03/14/20 1530)  gadobutrol (GADAVIST) 1 MMOL/ML injection 10 mL (10 mLs Intravenous Contrast Given 03/14/20 1255)    Mobility walks with person assist Low fall risk   Focused Assessments    R Recommendations: See Admitting Provider Note  Report given to:

## 2020-03-14 NOTE — Telephone Encounter (Signed)
I discussed with Dr. Jenne Campus- looks like the changes appear to be inflammatory. So, I will hold off oncology consultation for now. GB

## 2020-03-14 NOTE — ED Notes (Signed)
Pt taken to MRI  

## 2020-03-14 NOTE — Progress Notes (Signed)
PROGRESS NOTE    Daniel Sanders  ZOX:[id number] DOB: 1946-06-22 DOA: 03/13/2020 PCP: System, Provider Not In      Brief Narrative:  [name] is a 74 y.o. 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  In the ER, CT head showed mass like fullness of the right skull base, malignancy vs infection.  ENT and Oncology were consulted.         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  Evaluated by ENT who felt the process was infectious not malignant, and recommend further imaging.  MRI brain of the IAC showed "peripheral enhancement within the mastoid...may reflect abscess formation.  Thin right cerebral convexity subdural hematoma."  Case discussed with ENT, Dr. Jenne Campus.  This requires tertiary referral to ENT at academic center +/- neurosurgery.  No safe surgical approach to his disease is possible here.    I discussed with patient, who is unsure he would want surgery of any kind, will discuss with wife.  At present, Dr. Jenne Campus and I do NOT suspect meningoencephalitis, on a clinical basis (absolutely no fever, meningismus, neck rigidity, photophobia, mental status changes).  -Continue vancomycin, Zosyn -Follow ear culture  -I have spoken to the [facility] transfer, transfer declined due to lack of beds -I have spoken with [facility] transfer center, and await callback  -Consult palliative care -Consult ENT   Diabetes -Continue insulin lantus -Continue SS corrections  Hypertension -Continue lisinopril -Continue amlodipine -Continue aspirin, atorvastatin  Hyponatremia Present on admission  Anemia Mild, present on admission       Disposition: Status is: Inpatient  I have discussed with ENT who feel the patient can only receive definitive care at an outside facility.   Patient feels he must discuss this with his wife before he decides to undergo surgical debridment. He understands that even with surgical debridement, his disease process has high mortality and morbidity.    If he choose to pursue surgery, we will transfer to [facility] or [facility].  We have consulted Palliative care to assist in goals of care.                MDM: The below labs and imaging reports were reviewed and summarized above.  Medication management as above.    DVT prophylaxis: enoxaparin (LOVENOX) injection 40 mg Start: 03/14/20 0012  Code Status: FULL Family Communication: Wife by phone    Consultants:   ENT           Subjective: Patient sleepy, falls asleep once during our conversation, then wakes up.  No seizureactivity.  No photophobia, no fever, no neck pain, no headache.  Objective: Vitals:   03/14/20 0930 03/14/20 0933 03/14/20 1629 03/14/20 1805  BP: 116/69 (!) 118/60 (!) 149/69 (!) 132/74  Pulse: 82 72 80 87  Resp: (!) Temp:    98 F (36.7 C)  TempSrc:    Oral  SpO2: 94% 99% 95% 100%  Weight:      Height:        Intake/Output Summary (Last 24 hours) at 03/14/2020 1827 Last data filed at 03/14/2020 0230 Gross per 24 hour  Intake 900 ml  Output --  Net 900 ml   Filed Weights   03/14/20 0000  Weight: (!) 144 kg    Examination: General appearance:  adult male, alert and  in no distress.   HEENT: Anicteric, conjunctiva pink, lids and lashes normal. No nasal deformity, discharge, epistaxis.  Lips moist.   Skin: Warm and dry.  no jaundice.  No suspicious rashes or lesions. Cardiac: RRR, nl S1-S2, no murmurs appreciated.  Capillary refill is brisk.  JVP not visible.  No LE edema.  Radial  pulses 2+ and symmetric. Respiratory: Normal respiratory rate and rhythm.  CTAB without rales or wheezes. Abdomen: Abdomen soft.  no TTP. No ascites, distension, hepatosplenomegaly.   MSK: No deformities or effusions. Neuro: Awake and alert.   EOMI, moves all extremities. Speech fluent.    Psych: Sensorium intact and responding to questions, attention normal. Affect normal.  Judgment and insight appear normal.    Data Reviewed: I have personally reviewed following labs and imaging studies:  CBC: Recent Labs  Lab 03/13/20 1855 03/14/20 0457  WBC 8.9 5.6  NEUTROABS 3.6  --   HGB 11.3* 11.2*  HCT 35.5* 35.4*  MCV 85.5 85.3  PLT 350 334   Basic Metabolic Panel: Recent Labs  Lab 03/13/20 1855 03/14/20 0457  NA 133* 132*  K 4.5 4.7  CL 99 97*  CO2 23 26  GLUCOSE 123* 324*  BUN 12 12  CREATININE 0.78 0.60*  CALCIUM 8.5* 8.2*   GFR: Estimated Creatinine Clearance: 118.8 mL/min (A) (by C-G formula based on SCr of 0.6 mg/dL (L)). Liver Function Tests: Recent Labs  Lab 03/13/20 1855  AST 21  ALT 21  ALKPHOS 80  BILITOT 0.7  PROT 7.6  ALBUMIN 2.8*   No results for input(s): LIPASE, AMYLASE in the last 168 hours. No results for input(s): AMMONIA in the last 168 hours. Coagulation Profile: No results for input(s): INR, PROTIME in the last 168 hours. Cardiac Enzymes: No results for input(s): CKTOTAL, CKMB, CKMBINDEX, TROPONINI in the last 168 hours. BNP (last 3 results) No results for input(s): PROBNP in the last 8760 hours. HbA1C: No results for input(s): HGBA1C in the last 72 hours. CBG: Recent Labs  Lab 03/13/20 2232 03/14/20 0928 03/14/20 1812  GLUCAP 102* 292* 250*   Lipid Profile: No results for input(s): CHOL, HDL, LDLCALC, TRIG, CHOLHDL, LDLDIRECT in the last 72 hours. Thyroid Function Tests: No results for input(s): TSH, T4TOTAL, FREET4, T3FREE, THYROIDAB in the last 72 hours. Anemia Panel: No results for input(s): VITAMINB12, FOLATE, FERRITIN, TIBC, IRON, RETICCTPCT in the last 72 hours. Urine analysis: No results found for: COLORURINE, APPEARANCEUR, LABSPEC, PHURINE, GLUCOSEU, HGBUR, BILIRUBINUR, KETONESUR, PROTEINUR, UROBILINOGEN, NITRITE, LEUKOCYTESUR Sepsis  Labs: (procalcitonin:4,lacticacidven:4)  ) Recent Results (from the past 240 hour(s))  Blood culture (routine x 2)     Status: None (Preliminary result)   Collection Time: 03/13/20  7:37 PM   Specimen: BLOOD  Result Value Ref Range Status   Specimen Description BLOOD BLOOD RIGHT WRIST  Final   Special Requests   Final    BOTTLES DRAWN AEROBIC AND ANAEROBIC Blood Culture results may not be optimal due to an inadequate volume of blood received in culture bottles   Culture   Final    NO GROWTH < 12 HOURS Performed at [facility], [address] [place], [place] [address]    Report Status PENDING  Incomplete  Blood culture (routine x 2)     Status: None (Preliminary result)   Collection Time: 03/13/20  7:42 PM   Specimen: BLOOD  Result Value Ref Range Status   Specimen Description BLOOD LEFT ANTECUBITAL  Final   Special Requests   Final  BOTTLES DRAWN AEROBIC AND ANAEROBIC Blood Culture results may not be optimal due to an inadequate volume of blood received in culture bottles   Culture   Final    NO GROWTH < 12 HOURS Performed at [facility], [address] [place], [place] [address]    Report Status PENDING  Incomplete  SARS Coronavirus 2 by RT PCR (hospital order, performed in [facility] hospital lab) Nasopharyngeal Nasopharyngeal Swab     Status: None   Collection Time: 03/13/20 11:05 PM   Specimen: Nasopharyngeal Swab  Result Value Ref Range Status   SARS Coronavirus 2 NEGATIVE NEGATIVE Final    Comment: (NOTE) SARS-CoV-2 target nucleic acids are NOT DETECTED.  The SARS-CoV-2 RNA is generally detectable in upper and lower respiratory specimens during the acute phase of infection. The lowest concentration of SARS-CoV-2 viral copies this assay can detect is 250 copies / mL. A negative result does not preclude SARS-CoV-2 infection and should not be used as the sole basis for treatment or other patient management decisions.  A negative  result may occur with improper specimen collection / handling, submission of specimen other than nasopharyngeal swab, presence of viral mutation(s) within the areas targeted by this assay, and inadequate number of viral copies (<250 copies / mL). A negative result must be combined with clinical observations, patient history, and epidemiological information.  Fact Sheet for Patients:   BoilerBrush.com.cy  Fact Sheet for Healthcare Providers: https://pope.com/  This test is not yet approved or  cleared by the [place] FDA and has been authorized for detection and/or diagnosis of SARS-CoV-2 by FDA under an Emergency Use Authorization (EUA).  This EUA will remain in effect (meaning this test can be used) for the duration of the COVID-19 declaration under Section 564(b)(1) of the Act, 21 U.S.C. section 360bbb-3(b)(1), unless the authorization is terminated or revoked sooner.  Performed at [facility], [address] [place], [place] [address]   Aerobic/Anaerobic Culture (surgical/deep wound)     Status: None (Preliminary result)   Collection Time: 03/14/20  8:17 AM   Specimen: Ear  Result Value Ref Range Status   Specimen Description   Final    EAR RIGHT Performed at [facility], [address], [place], [place] [address]    Special Requests   Final    NONE Performed at [facility], [address] [place], [place] [address]    Gram Stain   Final    RARE WBC PRESENT,BOTH PMN AND MONONUCLEAR NO ORGANISMS SEEN Performed at [facility], [address] [place], [place] [address]    Culture PENDING  Incomplete   Report Status PENDING  Incomplete         Radiology Studies: CT Head Wo Contrast  Result Date: 03/13/2020 CLINICAL DATA:  Altered mental status EXAM: CT HEAD WITHOUT CONTRAST TECHNIQUE: Contiguous axial images were obtained from the base of the skull through the vertex  without intravenous contrast. COMPARISON:  09/07/2019 FINDINGS: Brain: Mild atrophic changes are noted. No findings to suggest acute hemorrhage, acute infarction or space-occupying mass lesion is seen. There is a chronic almost isodense collection in the subdural space on the right with minimal mass effect and maximum thickness of approximately 6 mm. No midline shift is seen. 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 Vascular: No hyperdense vessel or unexpected calcification. Skull: Normal. Negative for fracture or focal lesion. Sinuses/Orbits: The paranasal sinuses are within normal limits. The mastoid air cells  on the left are unremarkable. Fluid attenuation is noted throughout the mastoid air cells on the right as well as within the external auditory canal and middle ear. Other: In the right posterior nasopharynx, there is a 3.1 x 2.0 cm mass lesion identified which is new from the prior exam. This likely occludes the eustachian tube on the right causing the changes in the right middle ear. Additionally there is enlargement of the temporalis and masseter muscles surrounding the right mandible. Soft tissue density is noted on the right adjacent to the right ear as well. IMPRESSION: No acute intracranial abnormality is noted. Mild atrophic changes are seen. 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 No acute component is noted. New soft tissue mass lesion in the posterior nasopharynx on the right with opacification of the mastoid air cells on the right as well as the right middle ear and external auditory canal. Surrounding musculature enlargement is noted which may represent localized invasion. 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 Critical Value/emergent results were called by telephone at the time of interpretation on  03/13/2020 at 7:36 pm to Dr. Willy Eddy , who verbally acknowledged these results. Electronically Signed   By: Alcide Clever M.D.   On: 03/13/2020 19:40   CT Soft Tissue Neck W Contrast  Result Date: 03/13/2020 CLINICAL DATA:  Initial evaluation for neck mass. EXAM: CT NECK WITH CONTRAST TECHNIQUE: Multidetector CT imaging of the neck was performed using the standard protocol following the bolus administration of intravenous contrast. CONTRAST:  75mL OMNIPAQUE IOHEXOL 300 MG/ML  SOLN COMPARISON:  Comparison made with prior head CT from earlier the same day as well as prior temporal bone CT from 09/07/2019. FINDINGS: Pharynx and larynx: Oral cavity within normal limits without discrete mass or collection. No acute abnormality about the remaining dentition. No base of tongue lesion. Palatine tonsils fairly symmetric and within normal limits. There is an ill-defined infiltrative mass centered at the right fossa of Rosenmuller at the right nasopharynx, corresponding with abnormality seen on prior head CT. Although exact measurements are difficult given the infiltrative nature of the lesion, this measures approximately 4.7 x 3.4 x 4.5 cm (AP by transverse by craniocaudad). Associated invasion of the adjacent skull base with erosive changes of the adjacent right aspect of the clivus as well as the right occipital condyle. Probable involvement of the right carotid canal as well as the right jugular foramen. Right carotid space is involved just inferior to the skull base with abnormal soft tissue density surrounding the distal cervical right ICA. Extension into and erosive changes about the right hypoglossal canal. Lateral extension into the right infratemporal fossa with loss of normal fat planes throughout the pterygoid musculature (series 2, image 19). Loss of normal fat plane with the adjacent anterior aspect of the right parotid gland, likely involved (series 2, image 30). Involvement of the right  temporomandibular joint with erosive changes along the posterior right zygomatic arch, right mandibular condyle, and right EAC. The right EAC is completely opacified. Complete opacification of the right middle ear cavity could reflect postobstructive fluid or possibly tumor. Involvement of the inferior right mastoid air cells as well with associated osseous erosion (series 4, image 23). Associated right mastoid effusion, likely postobstructive for the most part. Further lateral extension with asymmetric enlargement of the right temporalis muscle as well as the right pre and postauricular soft tissues.  Soft tissue fullness seen at the right auricle and tragus. Finding highly concerning for an underlying malignancy, almost certainly aggressive in nature as these changes appear largely new as compared to prior temporal bone CT from 09/07/2019. Remainder of the hypopharynx and supraglottic larynx within normal limits. No retropharyngeal collection. Epiglottis normal. Vallecula clear. True cords symmetric and normal. Subglottic airway clear. Salivary glands: Asymmetric hyperdensity and irregularity seen involving the right parotid gland, which could reflect associated and/or concomitant acute parotitis. No obstructive stone or abscess. Remainder of the salivary glands including the left parotid gland and submandibular glands are within normal limits. Thyroid: Thyroid within normal limits. Lymph nodes: Asymmetric prominence of right-sided cervical lymph nodes seen as compared to the left, with the largest nodes seen at right level II measuring 10 mm in short axis (series 2, image 56). Scattered prominent posterior chain nodes noted on the right as well, largest of which measures 5 mm. No associated necrosis. While these nodes are not technically enlarged by size criteria, the asymmetric prominence and appearance of these nodes raises the possibility for possible nodal metastases. Vascular: Normal intravascular enhancement  is seen throughout the neck. In case mint of the distal cervical right ICA by the adjacent skull base tumor again noted. Similarly, the right internal jugular vein is compressed and not visualized at the skull base related to adjacent tumor, although appears patent above and below this level. Moderate atherosclerotic change noted about the aortic arch and carotid siphons. Limited intracranial: Visualized intracranial contents demonstrate no acute finding. No frank invasion of tumor into the intracranial cavity is visible, although this would be better assessed by MRI. Visualized orbits: Globes and orbital soft tissues within normal limits. Mastoids and visualized paranasal sinuses: Mild mucosal thickening noted within the right sphenoid and maxillary sinuses as well as the right ethmoidal air cells. Visualized paranasal sinuses are otherwise clear. Complete opacification of the right middle ear cavity and right mastoid air cells again noted, likely postobstructive for the most part, although direct tumor invasion not excluded. Left mastoid air cells and middle ear cavity remain clear. Skeleton: No other acute osseous abnormality identified. No other discrete or worrisome osseous lesions. Mild-to-moderate multilevel cervical spondylosis, most pronounced at C6-7. Upper chest: Visualized upper chest demonstrates no acute finding. Partially visualized lungs are clear. Other: None. IMPRESSION: 1. 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 Finding is highly concerning for underlying malignancy, with a primary nasopharyngeal carcinoma being the primary differential consideration. This lesion is almost certainly highly aggressive in nature as these changes appear  largely new as compared to recent temporal bone CT from 09/07/2019. ENT referral for further workup and consultation recommended. 2. Asymmetric prominence of right-sided cervical lymph nodes as compared to the left, nonspecific and may be reactive. Possible nodal metastases not excluded. 3. Asymmetric hyperdensity and irregularity involving the right parotid gland, which could be related to direct tumor invasion or possibly associated/concomitant parotitis. Correlation with symptomatology recommended. 4. Aortic Atherosclerosis (ICD10-I70.0). Electronically Signed   By: Rise MuBenjamin  McClintock M.D.   On: 03/13/2020 21:51   DG Chest Portable 1 View  Result Date: 03/13/2020 CLINICAL DATA:  74 year old male with altered mental status. EXAM: PORTABLE CHEST 1 VIEW COMPARISON:  None. FINDINGS: The lungs are clear. There is no pleural  effusion pneumothorax. The cardiac silhouette is within limits. Atherosclerotic calcification of the aortic arch. No acute osseous pathology. IMPRESSION: No active disease. Electronically Signed   By: Elgie Collard M.D.   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 CONTRAST:  35mL GADAVIST GADOBUTROL 1 MMOL/ML IV SOLN COMPARISON:  Correlation made with prior CT imaging FINDINGS: Brain: Inner ear structures demonstrate an unremarkable MR appearance. There is no abnormal enhancement within the internal auditory canals. No acute infarction or intracranial hemorrhage. There is no mass effect or edema. Prominence of the ventricles and sulci reflects generalized parenchymal volume loss. Patchy foci of T2 hyperintensity in the supratentorial white matter nonspecific but may reflect  mild to moderate chronic microvascular ischemic changes. There is a thin extra-axial collection along the right cerebral convexity measuring up to 6 mm in thickness. This was hypoattenuating on the prior head CT. There is corresponding T1 hyperintensity and susceptibility along the temporal convexity, which may reflect more blood products. Vascular: Major vessel flow voids at the skull base are preserved. Skull and upper cervical spine: See below. Sinuses/Orbits: Paranasal sinuses are clear. The orbits are unremarkable. Other: Right mastoid and middle ear opacification. Suggestion of peripheral enhancement within the mastoid. Right external auditory canal is opacified. There is abnormal signal and enhancement superficial to the right mastoid extending anteriorly into the temporal scalp and masticator, carotid, and parapharyngeal spaces at the level of the nasopharynx. Small volume fluid superficial to the mastoid demonstrates peripheral enhancement. See saved images series 17. There is also involvement of the right posterolateral nasopharyngeal wall. Right ICA flow void is preserved in this region. Diminished nondominant right vertebral artery flow void in this region may reflect slow flow. There is abnormal marrow signal and enhancement at the skull base involving the clivus and occipital condyle on the right and likely involving the right jugular and hypoglossal foramina. There is abnormal signal of the included right mandibular condyle, which appears eroded. Additional erosive changes are better seen CT. Right sigmoid sinus is patent. IMPRESSION: Similar findings to prior neck CT detailed above involving right mastoid and middle ear, masticator/carotid/parapharyngeal spaces, skull base, and right mandibular condyle. Although malignancy is a consideration, severe infection in the setting of a diabetic patient is favored. Peripheral enhancement within the mastoid as well as about small volume fluid superficial to  the mastoid may reflect abscess formation. Thin right cerebral convexity subdural hematoma without significant mass effect. Suspected more subacute blood products along the right temporal convexity; empyema is considered less likely given presence of corresponding susceptibility which reflects blood products. Electronically Signed   By: Guadlupe Spanish M.D.   On: 03/14/2020 13:51        Scheduled Meds: . amLODipine  5 mg Oral Daily  . aspirin EC  81 mg Oral Daily  . atorvastatin  20 mg Oral QHS  . enoxaparin (LOVENOX) injection  40 mg Subcutaneous Q12H  . furosemide  40 mg Oral QPM  . [START ON 03/15/2020] insulin aspart  0-20 Units Subcutaneous TID WC  . insulin aspart  0-5 Units Subcutaneous QHS  . insulin glargine  30 Units Subcutaneous BID  . lisinopril  40 mg Oral BID  . vitamin B-12  1,000 mcg Oral Daily  Continuous Infusions: . piperacillin-tazobactam (ZOSYN)  IV Stopped (03/14/20 1717)  . [START ON 03/15/2020] vancomycin       LOS: 1 day    Time spent: 35 minutes    Alberteen Sam, MD [facility] 03/14/2020, 6:27 PM     Please page though AMION or Epic secure chat:  For [employer], Higher education careers adviser

## 2020-03-14 NOTE — Progress Notes (Signed)
Spoke with ENT fellow at [facility].  In the absence of cranial nerve palsy, headache or mental status changes, they felt this was not a rapidly progressive process, did not require hospital-to-hospital transfer, recommended oral antibiotics and follow up in the office in 1-2 days.  I defer judgment on this to Dr. Jenne Campus, and will continue current aggressive cares with IV antibiotics, as we feel this is an osteomyelitis of the skull base, a high mortality condition.  Tomorrow morning, we will need to call [facility] transfer center again to request transfer at [phone]

## 2020-03-14 NOTE — Progress Notes (Addendum)
Pharmacy Antibiotic Note  Daniel Sanders is a 74 y.o. male admitted on 03/13/2020 with necrotizing otitis externa. Pharmacy has been consulted for vancomycin and Zosyn dosing.  Plan: Vancomycin 2500 mg IV x 1 loading dose followed by Vancomycin 1500 mg q12h. Will plan for vanc trough after 4-5 doses.  Zosyn 3.375g IV q8h (4 hour infusion).  Height: 6\' 2"  (188 cm) Weight: (!) 144 kg (317 lb 7.4 oz) IBW/kg (Calculated) : 82.2  Temp (24hrs), Avg:98 F (36.7 C), Min:98 F (36.7 C), Max:98 F (36.7 C)  Recent Labs  Lab 03/13/20 1855 03/13/20 2108 03/14/20 0457  WBC 8.9  --  5.6  CREATININE 0.78  --  0.60*  LATICACIDVEN 2.7* 2.0*  --     Estimated Creatinine Clearance: 118.8 mL/min (A) (by C-G formula based on SCr of 0.6 mg/dL (L)).    No Known Allergies  Antimicrobials this admission: Ciprofloxacin 7/27 >> 7/28 Ceftriaxone 1g 7/27 >> 7/27 Unasyn 7/27 >> 7/28 Vancomycin 7/28 >>  Zosyn 7/28 >>  Dose adjustments this admission: N/A  Microbiology results: 7/27 BCx: NGTD 7/27 Body fluid cx from right ear: NGTD 7/28 aerobic/anaerobic cx from right ear: NGTD  Thank you for allowing pharmacy to be a part of this patients care.  8/28, PharmD Pharmacy Resident  03/14/2020 11:19 AM

## 2020-03-14 NOTE — Consult Note (Signed)
Daniel Sanders, Daniel Sanders [id number] 12-03-1945 Daniel Sanders, *  Reason for Consult: Nasopharyngeal mass and severe otitis externa  HPI: [name] is well-known to me as I am seeing him in the past for otitis externa.  Earlier this year he was taken to the operating room for debridement of his ear canal a CT scan of the temporal bone at that time showed no evidence of significant middle ear or mastoid disease the nasopharynx appeared clear.  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  I had referred him to Dr. Shanna Sanders at [facility] for evaluation who agreed with the diagnosis and had recommended excision of the ear canal skin and possible obliteration however [name] did not want to proceed at that time.  This ear has been bothered him for over a year.  Over the last week to 10days he has not had any significant change in his ear pain but is just felt extremely weak.  He has had some mild dizziness when he stands up quickly but has had no vertigo.  His hearing remains unchanged.  He tells me that since the beginning of the medications started last night he feels much better pressure and tenderness over the right parotid and facial area has significantly improved as has his trismus.  Allergies: No Known Allergies  ROS: Review of systems normal other than 12 systems except per HPI.  PMH:  Past Medical History:  Diagnosis Date  . Arthritis   . Diabetes mellitus without complication (HCC)   . Hyperlipidemia associated with type 2 diabetes mellitus (HCC)   . Hypertension   . Obesity   . Sleep apnea    per patient, incorrect  . Venous insufficiency of both lower extremities 2021    FH: History reviewed. No pertinent family history.  SH:  Social History   Socioeconomic History  . Marital status: Married    Spouse name: Daniel Sanders  . Number of children: Not on file  . Years of education: Not on file  . Highest education level: Not on file   Occupational History  . Occupation: Nurse, adult    Comment: retired  Tobacco Use  . Smoking status: Former Smoker    Types: Cigarettes, Pipe  . Smokeless tobacco: Former Neurosurgeon    Quit date: Music therapist  . Vaping Use: Never used  Substance and Sexual Activity  . Alcohol use: Never    Comment: nothing for 36 years  . Drug use: Not on file    Comment: nothing in over 30 years  . Sexual activity: Not on file  Other Topics Concern  . Not on file  Social History Narrative  . Not on file   Social Determinants of Health   Financial Resource Strain:   . Difficulty of Paying Living Expenses:   Food Insecurity:   . Worried About Programme researcher, broadcasting/film/video in the Last Year:   . Barista in the Last Year:   Transportation Needs:   . Freight forwarder (Medical):   Marland Kitchen Lack of Transportation (Non-Medical):   Physical Activity:   . Days of Exercise per Week:   . Minutes of Exercise per Session:   Stress:   . Feeling of Stress :   Social Connections:   . Frequency of Communication with Friends and Family:   . Frequency of Social Gatherings with Friends and Family:   . Attends Religious Services:   . Active Member of Clubs or  Organizations:   . Attends Banker Meetings:   Marland Kitchen Marital Status:   Intimate Partner Violence:   . Fear of Current or Ex-Partner:   . Emotionally Abused:   Marland Kitchen Physically Abused:   . Sexually Abused:     PSH:  Past Surgical History:  Procedure Laterality Date  . MASS EXCISION Right 10/11/2019   Procedure: EXCISION EAR CANAL MASS;  Surgeon: Daniel Salmons, MD;  Location: [facility] ORS;  Service: ENT;  Laterality: Right;    Physical  Exam: The left ear is clear the right ear shows severe swelling of the ear canal and conchal bowl there is purulence in the ear canal.  There is mild fullness in the right parotid area but no discrete mass.  There is no TMJ tenderness there is no crepitus.  Palpation the neck I could palpate no  adenopathy.  Oral cavity oropharynx no evidence of lesion mild fullness behind the right tonsil but no evidence of ulceration or other mass.  The anterior nose was clear.  Procedure: After patient's consent a topical anesthetic of phenylephrine lidocaine solution was dropped within the right nostril approximately 2 cc was used.  After approximately 10 minutes a flexible fiberoptic laryngoscope was introduced into the nose examination the nasopharynx showed no evidence of significant large mass no ulceration the eustachian tube did not appear to be obstructed; there was purulence draining from the eustachian tube opening.  Oropharynx did show some mild fullness of the right tonsil region although this was not overly impressive tongue base and larynx appeared clear.   Review of CT scan from January of this year for of the temporal bone showed some disease of the external canal but no evidence of bony erosion and no evidence of nasopharyngeal mass.  Review of CT scan from last evening did show a large right nasopharyngeal mass which appeared to have some erosion of the skull base on the right-hand side there is also swelling within the parotid and questionable erosion of portions of the ear canal.   A/P: Severe otitis externa-I sent a culture of the ear canal drainage.  He appears to be better on the Unasyn and the Cipro I would continue that until culture results have returned.  Staph coverage is not maximized with these drugs, changing the Unasyn to clindamycin may be worthwhile although he appears to be improving.  We have continually been worried about him for necrotizing otitis externa which could be causing these combination of symptoms and the parotid and nasopharyngeal involvement, I will discuss this with my colleagues at [facility].  Nasopharyngeal mass-the CT scan is much more impressive than my exam with endoscopy, there is no significant ulceration or fullness noted now in the nasopharynx, this may be  due to the administration of the IV steroids and antibiotics which have helped.  I would recommend we continue the IV antibiotics and steroids for another 48 hours to repeat his CT scan to assess his nasopharyngeal mass.  I will also have my colleagues at [facility] who evaluated before to review the CT scan and if they think this possibly represents necrotizing otitis externa then we will have him transferred there for care.  If scan is not improved in 48 hours then I think a nasopharyngeal biopsy is warranted.  Dr. Donneta Romberg from oncology was present during the exam I showed him the nasopharynx as well.  He agrees with this plan we will continue to follow along with patient.   Davina Poke 03/14/2020 8:21 AM

## 2020-03-14 NOTE — Progress Notes (Signed)
Inpatient Diabetes Program Recommendations  AACE/ADA: New Consensus Statement on Inpatient Glycemic Control   Target Ranges:  Prepandial:   less than 140 mg/dL      Peak postprandial:   less than 180 mg/dL (1-2 hours)      Critically ill patients:  140 - 180 mg/dL   Results for Daniel Sanders, Daniel Sanders (MRN [id number]) as of 03/14/2020 12:57  Ref. Range 03/13/2020 22:32 03/14/2020 09:28  Glucose-Capillary Latest Ref Range: 70 - 99 mg/dL 562 (H) 130 (H)   Review of Glycemic Control  Diabetes history: DM2 Outpatient Diabetes medications: NPH 70 units QAM, NPH 60 units QPM, Regular 25 units QAM, Regular 20 units QPM Current orders for Inpatient glycemic control: 70/30 50 units QAM, 70/30 40 units QPM  Inpatient Diabetes Program Recommendations:    Insulin: Please consider ordering CBGs AC&HS with Novolog 0-9 units TID with meals and Novolog 0-5 units QHS.  Basal insulin: If patient becomes NPO and/or not eating well, may want to consider stopping 70/30 insulin and ordering Levemir or Lantus for basal insulin.  Thanks, Orlando Penner, RN, MSN, CDE Diabetes Coordinator Inpatient Diabetes Program [phone] (Team Pager from 8am to 5pm)

## 2020-03-14 NOTE — Progress Notes (Signed)
Request to [facility] was requested, he was accepted to ENT service there, but no beds available. They recommend to call [phone] every 24 hours to see if a bed is available.  Request to [facility] was requested. They recommend to call [phone] every 24 hours to see if a bed is available

## 2020-03-15 ENCOUNTER — Inpatient Hospital Stay: Payer: Self-pay

## 2020-03-15 DIAGNOSIS — R531 Weakness: Secondary | ICD-10-CM

## 2020-03-15 DIAGNOSIS — E1165 Type 2 diabetes mellitus with hyperglycemia: Secondary | ICD-10-CM

## 2020-03-15 DIAGNOSIS — M869 Osteomyelitis, unspecified: Secondary | ICD-10-CM

## 2020-03-15 DIAGNOSIS — E860 Dehydration: Secondary | ICD-10-CM

## 2020-03-15 LAB — COMPREHENSIVE METABOLIC PANEL
ALT: 19 U/L (ref 0–44)
AST: 17 U/L (ref 15–41)
Albumin: 2.7 g/dL — ABNORMAL LOW (ref 3.5–5.0)
Alkaline Phosphatase: 89 U/L (ref 38–126)
Anion gap: 12 (ref 5–15)
BUN: 12 mg/dL (ref 8–23)
CO2: 26 mmol/L (ref 22–32)
Calcium: 8 mg/dL — ABNORMAL LOW (ref 8.9–10.3)
Chloride: 96 mmol/L — ABNORMAL LOW (ref 98–111)
Creatinine, Ser: 0.76 mg/dL (ref 0.61–1.24)
GFR calc Af Amer: >60 mL/min (ref >60)
GFR calc non Af Amer: >60 mL/min (ref >60)
Glucose, Bld: 265 mg/dL — ABNORMAL HIGH (ref 70–99)
Potassium: 4 mmol/L (ref 3.5–5.1)
Sodium: 134 mmol/L — ABNORMAL LOW (ref 135–145)
Total Bilirubin: 0.6 mg/dL (ref 0.3–1.2)
Total Protein: 7.8 g/dL (ref 6.5–8.1)

## 2020-03-15 LAB — CBC
HCT: 36.1 % — ABNORMAL LOW (ref 39.0–52.0)
Hemoglobin: 11.3 g/dL — ABNORMAL LOW (ref 13.0–17.0)
MCH: 26.9 pg (ref 26.0–34.0)
MCHC: 31.3 g/dL (ref 30.0–36.0)
MCV: 86 fL (ref 80.0–100.0)
Platelets: 391 10*3/uL (ref 150–400)
RBC: 4.2 MIL/uL — ABNORMAL LOW (ref 4.22–5.81)
RDW: 14.1 % (ref 11.5–15.5)
WBC: 9 10*3/uL (ref 4.0–10.5)
nRBC: 0 % (ref 0.0–0.2)

## 2020-03-15 LAB — GLUCOSE, CAPILLARY
Glucose-Capillary: 236 mg/dL — ABNORMAL HIGH (ref 70–99)
Glucose-Capillary: 300 mg/dL — ABNORMAL HIGH (ref 70–99)
Glucose-Capillary: 261 mg/dL — ABNORMAL HIGH (ref 70–99)
Glucose-Capillary: 233 mg/dL — ABNORMAL HIGH (ref 70–99)

## 2020-03-15 LAB — EPSTEIN-BARR VIRUS (EBV) ANTIBODY PROFILE
EBV NA IgG: >600 U/mL — ABNORMAL HIGH (ref 0.0–17.9)
EBV VCA IgG: >600 U/mL — ABNORMAL HIGH (ref 0.0–17.9)
EBV VCA IgM: <36 U/mL (ref 0.0–35.9)

## 2020-03-15 LAB — HEMOGLOBIN A1C
Hgb A1c MFr Bld: 6.8 % — ABNORMAL HIGH (ref 4.8–5.6)
Mean Plasma Glucose: 148.46 mg/dL

## 2020-03-15 LAB — MAGNESIUM: Magnesium: 2 mg/dL (ref 1.7–2.4)

## 2020-03-15 MED ORDER — TOBRAMYCIN-DEXAMETHASONE 0.3-0.1 % OP SUSP
4.0000 [drp] | Freq: Two times a day (BID) | OPHTHALMIC | Status: DC
  Administered 2020-03-15 – 2020-03-19 (×8): 4 [drp] via OTIC
  Filled 2020-03-15 (×2): qty 2.5

## 2020-03-15 NOTE — Progress Notes (Signed)
[facility]  - [place] [facility]   PATIENT NAME: Daniel Sanders    MR#:  [id number]  DATE OF BIRTH:  April 08, 1946  SUBJECTIVE:    REVIEW OF SYSTEMS:   ROS Tolerating Diet: Tolerating PT:   DRUG ALLERGIES:  No Known Allergies  VITALS:  Blood pressure 127/80, pulse 78, temperature 98 F (36.7 C), temperature source Oral, resp. rate 16, height 6\' 2"  (1.88 m), weight (!) 144 kg, SpO2 100 %.  PHYSICAL EXAMINATION:   Physical Exam  GENERAL:  74 y.o.-year-old patient lying in the bed with no acute distress.  EYES: Pupils equal, round, reactive to light and accommodation. No scleral icterus.   HEENT: Head atraumatic, normocephalic. Oropharynx and nasopharynx clear.  NECK:  Supple, no jugular venous distention. No thyroid enlargement, no tenderness.  LUNGS: Normal breath sounds bilaterally, no wheezing, rales, rhonchi. No use of accessory muscles of respiration.  CARDIOVASCULAR: S1, S2 normal. No murmurs, rubs, or gallops.  ABDOMEN: Soft, nontender, nondistended. Bowel sounds present. No organomegaly or mass.  EXTREMITIES: No cyanosis, clubbing or edema b/l.    NEUROLOGIC: Cranial nerves II through XII are intact. No focal Motor or sensory deficits b/l.   PSYCHIATRIC:  patient is alert and oriented x 3.  SKIN: No obvious rash, lesion, or ulcer.   LABORATORY PANEL:  CBC Recent Labs  Lab 03/15/20 0432  WBC 9.0  HGB 11.3*  HCT 36.1*  PLT 391    Chemistries  Recent Labs  Lab 03/15/20 0432 03/15/20 0450  NA 134*  --   K 4.0  --   CL 96*  --   CO2 26  --   GLUCOSE 265*  --   BUN 12  --   CREATININE 0.76  --   CALCIUM 8.0*  --   MG  --  2.0  AST 17  --   ALT 19  --   ALKPHOS 89  --   BILITOT 0.6  --    Cardiac Enzymes No results for input(s): TROPONINI in the last 168 hours. RADIOLOGY:  CT Head Wo Contrast  Result Date: 03/13/2020 CLINICAL DATA:  Altered mental status EXAM: CT HEAD WITHOUT CONTRAST TECHNIQUE: Contiguous axial images were  obtained from the base of the skull through the vertex without intravenous contrast. COMPARISON:  09/07/2019 FINDINGS: Brain: Mild atrophic changes are noted. No findings to suggest acute hemorrhage, acute infarction or space-occupying mass lesion is seen. There is a chronic almost isodense collection in the subdural space on the right with minimal mass effect and maximum thickness of approximately 6 mm. No midline shift is seen. 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 Vascular: No hyperdense vessel or unexpected calcification. Skull: Normal. Negative for fracture or focal lesion. Sinuses/Orbits: The paranasal sinuses are within normal limits. The mastoid air cells on the left are unremarkable. Fluid attenuation is noted throughout the mastoid air cells on the right as well as within the external auditory canal and middle ear. Other: In the right posterior nasopharynx, there is a 3.1 x 2.0 cm mass lesion identified which is new from the prior exam. This likely occludes the eustachian tube on the right causing the changes in the right middle ear. Additionally there is enlargement of the temporalis and masseter muscles surrounding the right mandible. Soft tissue density is noted on the right adjacent to the right ear as well. IMPRESSION: No acute intracranial abnormality is noted. Mild atrophic changes are seen. Changes consistent with a chronic subdural hematoma  on the right without significant midline shift. No acute component is noted. New soft tissue mass lesion in the posterior nasopharynx on the right with opacification of the mastoid air cells on the right as well as the right middle ear and external auditory canal. Surrounding musculature enlargement is noted which may represent localized invasion. 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 Critical Value/emergent results  were called by telephone at the time of interpretation on 03/13/2020 at 7:36 pm to Dr. Willy Eddy , who verbally acknowledged these results. Electronically Signed   By: Alcide Clever M.D.   On: 03/13/2020 19:40   CT Soft Tissue Neck W Contrast  Result Date: 03/13/2020 CLINICAL DATA:  Initial evaluation for neck mass. EXAM: CT NECK WITH CONTRAST TECHNIQUE: Multidetector CT imaging of the neck was performed using the standard protocol following the bolus administration of intravenous contrast. CONTRAST:  66mL OMNIPAQUE IOHEXOL 300 MG/ML  SOLN COMPARISON:  Comparison made with prior head CT from earlier the same day as well as prior temporal bone CT from 09/07/2019. FINDINGS: Pharynx and larynx: Oral cavity within normal limits without discrete mass or collection. No acute abnormality about the remaining dentition. No base of tongue lesion. Palatine tonsils fairly symmetric and within normal limits. There is an ill-defined infiltrative mass centered at the right fossa of Rosenmuller at the right nasopharynx, corresponding with abnormality seen on prior head CT. Although exact measurements are difficult given the infiltrative nature of the lesion, this measures approximately 4.7 x 3.4 x 4.5 cm (AP by transverse by craniocaudad). Associated invasion of the adjacent skull base with erosive changes of the adjacent right aspect of the clivus as well as the right occipital condyle. Probable involvement of the right carotid canal as well as the right jugular foramen. Right carotid space is involved just inferior to the skull base with abnormal soft tissue density surrounding the distal cervical right ICA. Extension into and erosive changes about the right hypoglossal canal. Lateral extension into the right infratemporal fossa with loss of normal fat planes throughout the pterygoid musculature (series 2, image 19). Loss of normal fat plane with the adjacent anterior aspect of the right parotid gland, likely involved  (series 2, image 30). Involvement of the right temporomandibular joint with erosive changes along the posterior right zygomatic arch, right mandibular condyle, and right EAC. The right EAC is completely opacified. Complete opacification of the right middle ear cavity could reflect postobstructive fluid or possibly tumor. Involvement of the inferior right mastoid air cells as well with associated osseous erosion (series 4, image 23). Associated right mastoid effusion, likely postobstructive for the most part. Further lateral extension with asymmetric enlargement of the right temporalis muscle as well as the right pre and postauricular soft tissues. Soft tissue fullness seen at the right auricle and tragus. Finding highly concerning for an underlying malignancy, almost certainly aggressive in nature as these changes appear largely new as compared to prior temporal bone CT from 09/07/2019. Remainder of the hypopharynx and supraglottic larynx within normal limits. No retropharyngeal collection. Epiglottis normal. Vallecula clear. True cords symmetric and normal. Subglottic airway clear. Salivary glands: Asymmetric hyperdensity and irregularity seen involving the right parotid gland, which could reflect associated and/or concomitant acute parotitis. No obstructive stone or abscess. Remainder of the salivary glands including the left parotid gland and submandibular glands are within normal limits. Thyroid: Thyroid within normal limits. Lymph nodes: Asymmetric prominence of right-sided cervical  lymph nodes seen as compared to the left, with the largest nodes seen at right level II measuring 10 mm in short axis (series 2, image 56). Scattered prominent posterior chain nodes noted on the right as well, largest of which measures 5 mm. No associated necrosis. While these nodes are not technically enlarged by size criteria, the asymmetric prominence and appearance of these nodes raises the possibility for possible nodal  metastases. Vascular: Normal intravascular enhancement is seen throughout the neck. In case mint of the distal cervical right ICA by the adjacent skull base tumor again noted. Similarly, the right internal jugular vein is compressed and not visualized at the skull base related to adjacent tumor, although appears patent above and below this level. Moderate atherosclerotic change noted about the aortic arch and carotid siphons. Limited intracranial: Visualized intracranial contents demonstrate no acute finding. No frank invasion of tumor into the intracranial cavity is visible, although this would be better assessed by MRI. Visualized orbits: Globes and orbital soft tissues within normal limits. Mastoids and visualized paranasal sinuses: Mild mucosal thickening noted within the right sphenoid and maxillary sinuses as well as the right ethmoidal air cells. Visualized paranasal sinuses are otherwise clear. Complete opacification of the right middle ear cavity and right mastoid air cells again noted, likely postobstructive for the most part, although direct tumor invasion not excluded. Left mastoid air cells and middle ear cavity remain clear. Skeleton: No other acute osseous abnormality identified. No other discrete or worrisome osseous lesions. Mild-to-moderate multilevel cervical spondylosis, most pronounced at C6-7. Upper chest: Visualized upper chest demonstrates no acute finding. Partially visualized lungs are clear. Other: None. IMPRESSION: 1. 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 Finding is highly concerning for underlying malignancy, with a primary nasopharyngeal carcinoma being the primary differential consideration. This lesion is almost  certainly highly aggressive in nature as these changes appear largely new as compared to recent temporal bone CT from 09/07/2019. ENT referral for further workup and consultation recommended. 2. Asymmetric prominence of right-sided cervical lymph nodes as compared to the left, nonspecific and may be reactive. Possible nodal metastases not excluded. 3. Asymmetric hyperdensity and irregularity involving the right parotid gland, which could be related to direct tumor invasion or possibly associated/concomitant parotitis. Correlation with symptomatology recommended. 4. Aortic Atherosclerosis (ICD10-I70.0). Electronically Signed   By: Rise Mu M.D.   On: 03/13/2020 21:51   DG Chest Portable 1 View  Result Date: 03/13/2020 CLINICAL DATA:  74 year old male with altered mental status. EXAM: PORTABLE CHEST 1 VIEW COMPARISON:  None. FINDINGS: The lungs are clear. There is no pleural effusion pneumothorax. The cardiac silhouette is within limits. Atherosclerotic calcification of the aortic arch. No acute osseous pathology. IMPRESSION: No active disease. Electronically Signed   By: Elgie Collard M.D.   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 CONTRAST:  60mL GADAVIST GADOBUTROL 1 MMOL/ML IV SOLN COMPARISON:  Correlation made with prior CT imaging FINDINGS: Brain: Inner ear structures demonstrate an unremarkable MR appearance. There is  no abnormal enhancement within the internal auditory canals. No acute infarction or intracranial hemorrhage. There is no mass effect or edema. Prominence of the ventricles and sulci reflects generalized parenchymal volume loss. Patchy foci of T2 hyperintensity in  the supratentorial white matter nonspecific but may reflect mild to moderate chronic microvascular ischemic changes. There is a thin extra-axial collection along the right cerebral convexity measuring up to 6 mm in thickness. This was hypoattenuating on the prior head CT. There is corresponding T1 hyperintensity and susceptibility along the temporal convexity, which may reflect more blood products. Vascular: Major vessel flow voids at the skull base are preserved. Skull and upper cervical spine: See below. Sinuses/Orbits: Paranasal sinuses are clear. The orbits are unremarkable. Other: Right mastoid and middle ear opacification. Suggestion of peripheral enhancement within the mastoid. Right external auditory canal is opacified. There is abnormal signal and enhancement superficial to the right mastoid extending anteriorly into the temporal scalp and masticator, carotid, and parapharyngeal spaces at the level of the nasopharynx. Small volume fluid superficial to the mastoid demonstrates peripheral enhancement. See saved images series 17. There is also involvement of the right posterolateral nasopharyngeal wall. Right ICA flow void is preserved in this region. Diminished nondominant right vertebral artery flow void in this region may reflect slow flow. There is abnormal marrow signal and enhancement at the skull base involving the clivus and occipital condyle on the right and likely involving the right jugular and hypoglossal foramina. There is abnormal signal of the included right mandibular condyle, which appears eroded. Additional erosive changes are better seen CT. Right sigmoid sinus is patent. IMPRESSION: Similar findings to prior neck CT detailed above involving right mastoid and middle ear, masticator/carotid/parapharyngeal spaces, skull base, and right mandibular condyle. Although malignancy is a consideration, severe infection in the setting of a diabetic patient is favored. Peripheral enhancement within the  mastoid as well as about small volume fluid superficial to the mastoid may reflect abscess formation. Thin right cerebral convexity subdural hematoma without significant mass effect. Suspected more subacute blood products along the right temporal convexity; empyema is considered less likely given presence of corresponding susceptibility which reflects blood products. Electronically Signed   By: Guadlupe Spanish M.D.   On: 03/14/2020 13:51   [place] EKG SITE RITE  Result Date: 03/15/2020 If Site Rite image not attached, placement could not be confirmed due to current cardiac rhythm.  ASSESSMENT AND PLAN:  Jordan Pardini  is a 74 y.o. 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 -MRI brain of the IAC showed "peripheral enhancement within the mastoid...may reflect abscess formation.  Thin right cerebral convexity subdural hematoma." -Case discussed with ENT, Dr. Jenne Campus.  This requires tertiary referral to ENT at academic center +/- neurosurgery.  No safe surgical approach to his disease is possible here.   Dr. Jenne Campus has discussed with patient and wife. Patient does not want any surgery. Tertiary care transfer has been discontinued. -Continue vancomycin, Zosyn -Follow ear culture -- February  ER culture grew Pseudomonas -ID consultation with Dr. Sampson GoonFitzgerald-- recommends PICC line placement.  -Consult palliative care-- to meet with  wife and patient and discuss goals of care tomorrow. 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 patient getting IV antibiotics. PICC line needs to be placed. ID consultation for antibiotic. PT to see patient.       TOTAL TIME TAKING CARE OF THIS PATIENT: *25 minutes.  >50% time spent on counselling and coordination of care  Note: This dictation was prepared with Dragon dictation along with smaller phrase technology. Any transcriptional errors that result from this process are unintentional.  Enedina FinnerSona Erlinda Solinger M.D    [facility]   CC: Primary care physician; System, Provider Not InPatient ID: Daniel Sanders, male   DOB: 1945/10/27, 74 y.o.   MRN: [id number]

## 2020-03-15 NOTE — Progress Notes (Signed)
Arrived to obtain consent for PICC placement. After discussing risks, benefits, and alternatives, Patient requested waiting until after 10 AM meeting with physician group on 03-16-20 to make a decision. Sabrina RN notified.

## 2020-03-15 NOTE — Consult Note (Signed)
Infectious Disease     Reason for Consult: Skull osteomyelitis   Referring Physician: Dr Posey Pronto Date of Admission:  03/13/2020   Active Problems:   Weakness   HPI: Daniel Sanders is a 74 y.o. male with well controlled DM, long standing issues with R ear and otitis externa s/p surgery to remove polyps now with skull osteomyelitis. He has progressive drainage from ear and imaging showed osteo. He has no fevers, chills. Reports minimal pain. No HA.  'He has been seen by ENT and declines surgery. Will need long term I V abx.  I have discussed this with patient. Prior cx in Feb with Pseudomonas. Current cx pending.  Past Medical History:  Diagnosis Date  . Arthritis   . Diabetes mellitus without complication ([place])   . Hyperlipidemia associated with type 2 diabetes mellitus ([place])   . Hypertension   . Obesity   . Sleep apnea    per patient, incorrect  . Venous insufficiency of both lower extremities 2021   Past Surgical History:  Procedure Laterality Date  . MASS EXCISION Right 10/11/2019   Procedure: EXCISION EAR CANAL MASS;  Surgeon: Beverly Gust, MD;  Location: [facility] ORS;  Service: ENT;  Laterality: Right;   Social History   Tobacco Use  . Smoking status: Former Smoker    Types: Cigarettes, Pipe  . Smokeless tobacco: Former Systems developer    Quit date: Biochemist, clinical  . Vaping Use: Never used  Substance Use Topics  . Alcohol use: Never    Comment: nothing for 36 years  . Drug use: Not on file    Comment: nothing in over 30 years   History reviewed. No pertinent family history.  Allergies: No Known Allergies  Current antibiotics: Antibiotics Given (last 72 hours)    Date/Time Action Medication Dose Rate   03/14/20 0050 Given   ciprofloxacin (CIPRO) tablet 500 mg 500 mg    03/14/20 0052 New Bag/Given   Ampicillin-Sulbactam (UNASYN) 3 g in sodium chloride 0.9 % 100 mL IVPB 3 g 200 mL/hr   03/14/20 0934 New Bag/Given   Ampicillin-Sulbactam (UNASYN) 3 g in sodium chloride 0.9 %  100 mL IVPB 3 g 200 mL/hr   03/14/20 4431 Given   ciprofloxacin (CIPRO) tablet 500 mg 500 mg    03/14/20 1304 New Bag/Given   piperacillin-tazobactam (ZOSYN) IVPB 3.375 g 3.375 g 12.5 mL/hr   03/14/20 1330 New Bag/Given   vancomycin (VANCOCIN) 2,500 mg in sodium chloride 0.9 % 500 mL IVPB 2,500 mg 250 mL/hr   03/14/20 2106 New Bag/Given   piperacillin-tazobactam (ZOSYN) IVPB 3.375 g 3.375 g 12.5 mL/hr   03/15/20 0105 New Bag/Given   vancomycin (VANCOREADY) IVPB 1500 mg/300 mL 1,500 mg 150 mL/hr   03/15/20 0518 New Bag/Given   piperacillin-tazobactam (ZOSYN) IVPB 3.375 g 3.375 g 12.5 mL/hr      MEDICATIONS: . amLODipine  5 mg Oral Daily  . aspirin EC  81 mg Oral Daily  . atorvastatin  20 mg Oral QHS  . dexamethasone (DECADRON) injection  4 mg Intravenous Q8H  . enoxaparin (LOVENOX) injection  40 mg Subcutaneous Q12H  . furosemide  40 mg Oral QPM  . insulin aspart  0-20 Units Subcutaneous TID WC  . insulin aspart  0-5 Units Subcutaneous QHS  . insulin glargine  30 Units Subcutaneous BID  . lisinopril  40 mg Oral BID  . tobramycin-dexamethasone  4 drop Right EAR BID  . vitamin B-12  1,000 mcg Oral Daily    Review of  Systems - 11 systems reviewed and negative per HPI   OBJECTIVE: Temp:  [97.5 F (36.4 C)-98 F (36.7 C)] 98 F (36.7 C) (07/29 0829) Pulse Rate:  [78-87] 78 (07/29 0829) Resp:  [16-18] 16 (07/29 0829) BP: (115-149)/(69-80) 127/80 (07/29 0829) SpO2:  [95 %-100 %] 100 % (07/29 0829) Physical Exam  Constitutional: He is oriented to person, place, and time. He appears well-developed and well-nourished. No distress.  HENT: R ear with drainage and thick crusting Mouth/Throat: Oropharynx is clear and moist. No oropharyngeal exudate.  Cardiovascular: Normal rate, regular rhythm and normal heart sounds. Pulmonary/Chest: Effort normal and breath sounds normal. No respiratory distress. He has no wheezes.  Abdominal: Soft. Bowel sounds are normal. He exhibits no  distension. There is no tenderness.  Lymphadenopathy:  He has no cervical adenopathy.  Neurological: He is alert and oriented to person, place, and time.  Skin: Skin is warm and dry. No rash noted. No erythema.  Psychiatric: He has a normal mood and affect. His behavior is normal.      LABS: Results for orders placed or performed during the hospital encounter of 03/13/20 (from the past 48 hour(s))  CBC with Differential/Platelet     Status: Abnormal   Collection Time: 03/13/20  6:55 PM  Result Value Ref Range   WBC 8.9 4.0 - 10.5 K/uL   RBC 4.15 (L) 4.22 - 5.81 MIL/uL   Hemoglobin 11.3 (L) 13.0 - 17.0 g/dL   HCT 35.5 (L) 39 - 52 %   MCV 85.5 80.0 - 100.0 fL   MCH 27.2 26.0 - 34.0 pg   MCHC 31.8 30.0 - 36.0 g/dL   RDW 14.2 11.5 - 15.5 %   Platelets 350 150 - 400 K/uL   nRBC 0.0 0.0 - 0.2 %   Neutrophils Relative % 41 %   Neutro Abs 3.6 1.7 - 7.7 K/uL   Lymphocytes Relative 48 %   Lymphs Abs 4.2 (H) 0.7 - 4.0 K/uL   Monocytes Relative 9 %   Monocytes Absolute 0.8 0 - 1 K/uL   Eosinophils Relative 1 %   Eosinophils Absolute 0.1 0 - 0 K/uL   Basophils Relative 1 %   Basophils Absolute 0.1 0 - 0 K/uL   Immature Granulocytes 0 %   Abs Immature Granulocytes 0.04 0.00 - 0.07 K/uL    Comment: Performed at [facility], [address] [place], [place] [address]  Comprehensive metabolic panel     Status: Abnormal   Collection Time: 03/13/20  6:55 PM  Result Value Ref Range   Sodium 133 (L) 135 - 145 mmol/L   Potassium 4.5 3.5 - 5.1 mmol/L   Chloride 99 98 - 111 mmol/L   CO2 23 22 - 32 mmol/L   Glucose, Bld 123 (H) 70 - 99 mg/dL    Comment: Glucose reference range applies only to samples taken after fasting for at least 8 hours.   BUN 12 8 - 23 mg/dL   Creatinine, Ser 0.78 0.61 - 1.24 mg/dL   Calcium 8.5 (L) 8.9 - 10.3 mg/dL   Total Protein 7.6 6.5 - 8.1 g/dL   Albumin 2.8 (L) 3.5 - 5.0 g/dL   AST 21 15 - 41 U/L   ALT 21 0 - 44 U/L   Alkaline Phosphatase 80 38 -  126 U/L   Total Bilirubin 0.7 0.3 - 1.2 mg/dL   GFR calc non Af Amer >60 >60 mL/min   GFR calc Af Amer >60 >60 mL/min   Anion  gap 11 5 - 15    Comment: Performed at [facility], [address] [place], [place] [address]  Lactic acid, plasma     Status: Abnormal   Collection Time: 03/13/20  6:55 PM  Result Value Ref Range   Lactic Acid, Venous 2.7 (HH) 0.5 - 1.9 mmol/L    Comment: CRITICAL RESULT CALLED TO, READ BACK BY AND VERIFIED WITH GRACIE [facility] _0  03/13/20 MJU Performed at [facility], [place], [place] [address]   Troponin I (High Sensitivity)     Status: None   Collection Time: 03/13/20  6:55 PM  Result Value Ref Range   Troponin I (High Sensitivity) 9 <18 ng/L    Comment: (NOTE) Elevated high sensitivity troponin I (hsTnI) values and significant  changes across serial measurements may suggest ACS but many other  chronic and acute conditions are known to elevate hsTnI results.  Refer to the "Links" section for chest pain algorithms and additional  guidance. Performed at [facility], [address] [place], [place] [address]   Blood culture (routine x 2)     Status: None (Preliminary result)   Collection Time: 03/13/20  7:37 PM   Specimen: BLOOD  Result Value Ref Range   Specimen Description BLOOD BLOOD RIGHT WRIST    Special Requests      BOTTLES DRAWN AEROBIC AND ANAEROBIC Blood Culture results may not be optimal due to an inadequate volume of blood received in culture bottles   Culture      NO GROWTH 2 DAYS Performed at [facility], [address], [place], [place] [address]    Report Status PENDING   Blood culture (routine x 2)     Status: None (Preliminary result)   Collection Time: 03/13/20  7:42 PM   Specimen: BLOOD  Result Value Ref Range   Specimen Description BLOOD LEFT ANTECUBITAL    Special Requests      BOTTLES DRAWN AEROBIC AND ANAEROBIC Blood Culture results may not be optimal due to an  inadequate volume of blood received in culture bottles   Culture      NO GROWTH 2 DAYS Performed at [facility], [address] [place], [place] [address]    Report Status PENDING   Troponin I (High Sensitivity)     Status: None   Collection Time: 03/13/20  9:08 PM  Result Value Ref Range   Troponin I (High Sensitivity) 7 <18 ng/L    Comment: (NOTE) Elevated high sensitivity troponin I (hsTnI) values and significant  changes across serial measurements may suggest ACS but many other  chronic and acute conditions are known to elevate hsTnI results.  Refer to the "Links" section for chest pain algorithms and additional  guidance. Performed at [facility], [address] [place], [place] [address]   Lactic acid, plasma     Status: Abnormal   Collection Time: 03/13/20  9:08 PM  Result Value Ref Range   Lactic Acid, Venous 2.0 (HH) 0.5 - 1.9 mmol/L    Comment: CRITICAL VALUE NOTED. VALUE IS CONSISTENT WITH PREVIOUSLY REPORTED/CALLED VALUE RH Performed at [facility], [address] [place], [place] [address]   Glucose, capillary     Status: Abnormal   Collection Time: 03/13/20 10:32 PM  Result Value Ref Range   Glucose-Capillary 102 (H) 70 - 99 mg/dL    Comment: Glucose reference range applies only to samples taken after fasting for at least 8 hours.  SARS Coronavirus 2  by RT PCR (hospital order, performed in [facility] hospital lab) Nasopharyngeal Nasopharyngeal Swab     Status: None   Collection Time: 03/13/20 11:05 PM   Specimen: Nasopharyngeal Swab  Result Value Ref Range   SARS Coronavirus 2 NEGATIVE NEGATIVE    Comment: (NOTE) SARS-CoV-2 target nucleic acids are NOT DETECTED.  The SARS-CoV-2 RNA is generally detectable in upper and lower respiratory specimens during the acute phase of infection. The lowest concentration of SARS-CoV-2 viral copies this assay can detect is 250 copies / mL. A negative result does not preclude SARS-CoV-2  infection and should not be used as the sole basis for treatment or other patient management decisions.  A negative result may occur with improper specimen collection / handling, submission of specimen other than nasopharyngeal swab, presence of viral mutation(s) within the areas targeted by this assay, and inadequate number of viral copies (<250 copies / mL). A negative result must be combined with clinical observations, patient history, and epidemiological information.  Fact Sheet for Patients:   StrictlyIdeas.no  Fact Sheet for Healthcare Providers: BankingDealers.co.za  This test is not yet approved or  cleared by the [place] FDA and has been authorized for detection and/or diagnosis of SARS-CoV-2 by FDA under an Emergency Use Authorization (EUA).  This EUA will remain in effect (meaning this test can be used) for the duration of the COVID-19 declaration under Section 564(b)(1) of the Act, 21 U.S.C. section 360bbb-3(b)(1), unless the authorization is terminated or revoked sooner.  Performed at [facility], [address] [place], [place] [address]   Basic metabolic panel     Status: Abnormal   Collection Time: 03/14/20  4:57 AM  Result Value Ref Range   Sodium 132 (L) 135 - 145 mmol/L   Potassium 4.7 3.5 - 5.1 mmol/L   Chloride 97 (L) 98 - 111 mmol/L   CO2 26 22 - 32 mmol/L   Glucose, Bld 324 (H) 70 - 99 mg/dL    Comment: Glucose reference range applies only to samples taken after fasting for at least 8 hours.   BUN 12 8 - 23 mg/dL   Creatinine, Ser 0.60 (L) 0.61 - 1.24 mg/dL   Calcium 8.2 (L) 8.9 - 10.3 mg/dL   GFR calc non Af Amer >60 >60 mL/min   GFR calc Af Amer >60 >60 mL/min   Anion gap 9 5 - 15    Comment: Performed at [facility], [address] [place], [place] [address]  CBC     Status: Abnormal   Collection Time: 03/14/20  4:57 AM  Result Value Ref Range   WBC 5.6 4.0 - 10.5 K/uL    RBC 4.15 (L) 4.22 - 5.81 MIL/uL   Hemoglobin 11.2 (L) 13.0 - 17.0 g/dL   HCT 35.4 (L) 39 - 52 %   MCV 85.3 80.0 - 100.0 fL   MCH 27.0 26.0 - 34.0 pg   MCHC 31.6 30.0 - 36.0 g/dL   RDW 14.1 11.5 - 15.5 %   Platelets 334 150 - 400 K/uL   nRBC 0.0 0.0 - 0.2 %    Comment: Performed at [facility], [address] [place], [place] [address]  Aerobic/Anaerobic Culture (surgical/deep wound)     Status: None (Preliminary result)   Collection Time: 03/14/20  8:17 AM   Specimen: Ear  Result Value Ref Range   Specimen Description      EAR RIGHT Performed at [facility], [address], [place], [place] [address]  Special Requests      NONE Performed at [facility], [place], [place] [address]    Gram Stain      RARE WBC PRESENT,BOTH PMN AND MONONUCLEAR NO ORGANISMS SEEN Performed at [facility], [place] [address] [place], [place] [address]    Culture PENDING    Report Status PENDING   Glucose, capillary     Status: Abnormal   Collection Time: 03/14/20  9:28 AM  Result Value Ref Range   Glucose-Capillary 292 (H) 70 - 99 mg/dL    Comment: Glucose reference range applies only to samples taken after fasting for at least 8 hours.  Glucose, capillary     Status: Abnormal   Collection Time: 03/14/20  6:12 PM  Result Value Ref Range   Glucose-Capillary 250 (H) 70 - 99 mg/dL    Comment: Glucose reference range applies only to samples taken after fasting for at least 8 hours.  Hemoglobin A1c     Status: Abnormal   Collection Time: 03/14/20  6:24 PM  Result Value Ref Range   Hgb A1c MFr Bld 6.8 (H) 4.8 - 5.6 %    Comment: (NOTE) Pre diabetes:          5.7%-6.4%  Diabetes:              >6.4%  Glycemic control for   <7.0% adults with diabetes    Mean Plasma Glucose 148.46 mg/dL    Comment: Performed at [facility] [address] [place], [place] [address]  Glucose, capillary     Status: Abnormal   Collection Time: 03/14/20   8:40 PM  Result Value Ref Range   Glucose-Capillary 294 (H) 70 - 99 mg/dL    Comment: Glucose reference range applies only to samples taken after fasting for at least 8 hours.   Comment 1 Notify RN   Glucose, capillary     Status: Abnormal   Collection Time: 03/14/20  9:01 PM  Result Value Ref Range   Glucose-Capillary 298 (H) 70 - 99 mg/dL    Comment: Glucose reference range applies only to samples taken after fasting for at least 8 hours.  Comprehensive metabolic panel     Status: Abnormal   Collection Time: 03/15/20  4:32 AM  Result Value Ref Range   Sodium 134 (L) 135 - 145 mmol/L   Potassium 4.0 3.5 - 5.1 mmol/L   Chloride 96 (L) 98 - 111 mmol/L   CO2 26 22 - 32 mmol/L   Glucose, Bld 265 (H) 70 - 99 mg/dL    Comment: Glucose reference range applies only to samples taken after fasting for at least 8 hours.   BUN 12 8 - 23 mg/dL   Creatinine, Ser 0.76 0.61 - 1.24 mg/dL   Calcium 8.0 (L) 8.9 - 10.3 mg/dL   Total Protein 7.8 6.5 - 8.1 g/dL   Albumin 2.7 (L) 3.5 - 5.0 g/dL   AST 17 15 - 41 U/L   ALT 19 0 - 44 U/L   Alkaline Phosphatase 89 38 - 126 U/L   Total Bilirubin 0.6 0.3 - 1.2 mg/dL   GFR calc non Af Amer >60 >60 mL/min   GFR calc Af Amer >60 >60 mL/min   Anion gap 12 5 - 15    Comment: Performed at [facility], [address], [place], [place] [address]  CBC     Status: Abnormal   Collection Time: 03/15/20  4:32 AM  Result Value  Ref Range   WBC 9.0 4.0 - 10.5 K/uL   RBC 4.20 (L) 4.22 - 5.81 MIL/uL   Hemoglobin 11.3 (L) 13.0 - 17.0 g/dL   HCT 36.1 (L) 39 - 52 %   MCV 86.0 80.0 - 100.0 fL   MCH 26.9 26.0 - 34.0 pg   MCHC 31.3 30.0 - 36.0 g/dL   RDW 14.1 11.5 - 15.5 %   Platelets 391 150 - 400 K/uL   nRBC 0.0 0.0 - 0.2 %    Comment: Performed at [facility], [address] [place], [place] [address]  Magnesium     Status: None   Collection Time: 03/15/20  4:50 AM  Result Value Ref Range   Magnesium 2.0 1.7 - 2.4 mg/dL    Comment: Performed  at [facility], [address] [place], [place] [address]  Glucose, capillary     Status: Abnormal   Collection Time: 03/15/20  8:31 AM  Result Value Ref Range   Glucose-Capillary 236 (H) 70 - 99 mg/dL    Comment: Glucose reference range applies only to samples taken after fasting for at least 8 hours.  Glucose, capillary     Status: Abnormal   Collection Time: 03/15/20 12:33 PM  Result Value Ref Range   Glucose-Capillary 300 (H) 70 - 99 mg/dL    Comment: Glucose reference range applies only to samples taken after fasting for at least 8 hours.   No components found for: ESR, C REACTIVE PROTEIN MICRO: Recent Results (from the past 720 hour(s))  Blood culture (routine x 2)     Status: None (Preliminary result)   Collection Time: 03/13/20  7:37 PM   Specimen: BLOOD  Result Value Ref Range Status   Specimen Description BLOOD BLOOD RIGHT WRIST  Final   Special Requests   Final    BOTTLES DRAWN AEROBIC AND ANAEROBIC Blood Culture results may not be optimal due to an inadequate volume of blood received in culture bottles   Culture   Final    NO GROWTH 2 DAYS Performed at [facility], [address] [place], [place] [address]    Report Status PENDING  Incomplete  Blood culture (routine x 2)     Status: None (Preliminary result)   Collection Time: 03/13/20  7:42 PM   Specimen: BLOOD  Result Value Ref Range Status   Specimen Description BLOOD LEFT ANTECUBITAL  Final   Special Requests   Final    BOTTLES DRAWN AEROBIC AND ANAEROBIC Blood Culture results may not be optimal due to an inadequate volume of blood received in culture bottles   Culture   Final    NO GROWTH 2 DAYS Performed at [facility], [address], [place], [place] [address]    Report Status PENDING  Incomplete  SARS Coronavirus 2 by RT PCR (hospital order, performed in [place] hospital lab) Nasopharyngeal Nasopharyngeal Swab     Status: None   Collection Time: 03/13/20 11:05 PM    Specimen: Nasopharyngeal Swab  Result Value Ref Range Status   SARS Coronavirus 2 NEGATIVE NEGATIVE Final    Comment: (NOTE) SARS-CoV-2 target nucleic acids are NOT DETECTED.  The SARS-CoV-2 RNA is generally detectable in upper and lower respiratory specimens during the acute phase of infection. The lowest concentration of SARS-CoV-2 viral copies this assay can detect is 250 copies / mL. A negative result does not preclude SARS-CoV-2 infection and should not be used as the sole basis for treatment or other patient management  decisions.  A negative result may occur with improper specimen collection / handling, submission of specimen other than nasopharyngeal swab, presence of viral mutation(s) within the areas targeted by this assay, and inadequate number of viral copies (<250 copies / mL). A negative result must be combined with clinical observations, patient history, and epidemiological information.  Fact Sheet for Patients:   StrictlyIdeas.no  Fact Sheet for Healthcare Providers: BankingDealers.co.za  This test is not yet approved or  cleared by the [place] FDA and has been authorized for detection and/or diagnosis of SARS-CoV-2 by FDA under an Emergency Use Authorization (EUA).  This EUA will remain in effect (meaning this test can be used) for the duration of the COVID-19 declaration under Section 564(b)(1) of the Act, 21 U.S.C. section 360bbb-3(b)(1), unless the authorization is terminated or revoked sooner.  Performed at [facility], [address] [place], [place] [address]   Aerobic/Anaerobic Culture (surgical/deep wound)     Status: None (Preliminary result)   Collection Time: 03/14/20  8:17 AM   Specimen: Ear  Result Value Ref Range Status   Specimen Description   Final    EAR RIGHT Performed at [facility], [address] [place], [place] [address]    Special Requests   Final     NONE Performed at [facility] [address] [place], [place] [address]    Gram Stain   Final    RARE WBC PRESENT,BOTH PMN AND MONONUCLEAR NO ORGANISMS SEEN Performed at [facility], [place] [address] [place], [place] [address]    Culture PENDING  Incomplete   Report Status PENDING  Incomplete    IMAGING: CT Head Wo Contrast  Result Date: 03/13/2020 CLINICAL DATA:  Altered mental status EXAM: CT HEAD WITHOUT CONTRAST TECHNIQUE: Contiguous axial images were obtained from the base of the skull through the vertex without intravenous contrast. COMPARISON:  09/07/2019 FINDINGS: Brain: Mild atrophic changes are noted. No findings to suggest acute hemorrhage, acute infarction or space-occupying mass lesion is seen. There is a chronic almost isodense collection in the subdural space on the right with minimal mass effect and maximum thickness of approximately 6 mm. No midline shift is seen. 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 Vascular: No hyperdense vessel or unexpected calcification. Skull: Normal. Negative for fracture or focal lesion. Sinuses/Orbits: The paranasal sinuses are within normal limits. The mastoid air cells on the left are unremarkable. Fluid attenuation is noted throughout the mastoid air cells on the right as well as within the external auditory canal and middle ear. Other: In the right posterior nasopharynx, there is a 3.1 x 2.0 cm mass lesion identified which is new from the prior exam. This likely occludes the eustachian tube on the right causing the changes in the right middle ear. Additionally there is enlargement of the temporalis and masseter muscles surrounding the right mandible. Soft tissue density is noted on the right adjacent to the right ear as well. IMPRESSION: No acute intracranial abnormality is noted. Mild atrophic changes are seen. 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 No acute component is noted. New soft tissue mass lesion in the posterior nasopharynx on the right with opacification of the mastoid air cells on the right as well as the right middle ear and external auditory canal. Surrounding musculature enlargement is noted which may represent localized invasion. 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 Critical Value/emergent results were called by telephone at the time of interpretation on 03/13/2020 at 7:36 pm to Dr. Merlyn Lot , who verbally acknowledged these results. Electronically Signed   By: Inez Catalina M.D.   On: 03/13/2020 19:40   CT Soft Tissue Neck W Contrast  Result Date: 03/13/2020 CLINICAL DATA:  Initial evaluation for neck mass. EXAM: CT NECK WITH CONTRAST TECHNIQUE: Multidetector CT imaging of the neck was performed using the standard protocol following the bolus administration of intravenous contrast. CONTRAST:  82m OMNIPAQUE IOHEXOL 300 MG/ML  SOLN COMPARISON:  Comparison made with prior head CT from earlier the same day as well as prior temporal bone CT from 09/07/2019. FINDINGS: Pharynx and larynx: Oral cavity within normal limits without discrete mass or collection. No acute abnormality about the remaining dentition. No base of tongue lesion. Palatine tonsils fairly symmetric and within normal limits. There is an ill-defined infiltrative mass centered at the right fossa of Rosenmuller at the right nasopharynx, corresponding with abnormality seen on prior head CT. Although exact measurements are difficult given the infiltrative nature of the lesion, this measures approximately 4.7 x 3.4 x 4.5 cm (AP by transverse by craniocaudad). Associated invasion of the adjacent skull base with erosive changes of the adjacent right aspect of the clivus as well as the right occipital condyle. Probable involvement of the right carotid canal as  well as the right jugular foramen. Right carotid space is involved just inferior to the skull base with abnormal soft tissue density surrounding the distal cervical right ICA. Extension into and erosive changes about the right hypoglossal canal. Lateral extension into the right infratemporal fossa with loss of normal fat planes throughout the pterygoid musculature (series 2, image 19). Loss of normal fat plane with the adjacent anterior aspect of the right parotid gland, likely involved (series 2, image 30). Involvement of the right temporomandibular joint with erosive changes along the posterior right zygomatic arch, right mandibular condyle, and right EAC. The right EAC is completely opacified. Complete opacification of the right middle ear cavity could reflect postobstructive fluid or possibly tumor. Involvement of the inferior right mastoid air cells as well with associated osseous erosion (series 4, image 23). Associated right mastoid effusion, likely postobstructive for the most part. Further lateral extension with asymmetric enlargement of the right temporalis muscle as well as the right pre and postauricular soft tissues. Soft tissue fullness seen at the right auricle and tragus. Finding highly concerning for an underlying malignancy, almost certainly aggressive in nature as these changes appear largely new as compared to prior temporal bone CT from 09/07/2019. Remainder of the hypopharynx and supraglottic larynx within normal limits. No retropharyngeal collection. Epiglottis normal. Vallecula clear. True cords symmetric and normal. Subglottic airway clear. Salivary glands: Asymmetric hyperdensity and irregularity seen involving the right parotid gland, which could reflect associated and/or concomitant acute parotitis. No obstructive stone or abscess. Remainder of the salivary glands including the left parotid gland and submandibular glands are within normal limits. Thyroid: Thyroid within normal limits.  Lymph nodes: Asymmetric prominence of right-sided cervical lymph nodes seen as compared to the left, with the largest nodes seen at right level II measuring 10 mm in short axis (series 2, image 56). Scattered prominent posterior chain nodes noted on the right as well, largest of which measures 5 mm. No associated necrosis. While these nodes are not technically enlarged by size criteria, the asymmetric prominence and appearance of these nodes raises the  possibility for possible nodal metastases. Vascular: Normal intravascular enhancement is seen throughout the neck. In case mint of the distal cervical right ICA by the adjacent skull base tumor again noted. Similarly, the right internal jugular vein is compressed and not visualized at the skull base related to adjacent tumor, although appears patent above and below this level. Moderate atherosclerotic change noted about the aortic arch and carotid siphons. Limited intracranial: Visualized intracranial contents demonstrate no acute finding. No frank invasion of tumor into the intracranial cavity is visible, although this would be better assessed by MRI. Visualized orbits: Globes and orbital soft tissues within normal limits. Mastoids and visualized paranasal sinuses: Mild mucosal thickening noted within the right sphenoid and maxillary sinuses as well as the right ethmoidal air cells. Visualized paranasal sinuses are otherwise clear. Complete opacification of the right middle ear cavity and right mastoid air cells again noted, likely postobstructive for the most part, although direct tumor invasion not excluded. Left mastoid air cells and middle ear cavity remain clear. Skeleton: No other acute osseous abnormality identified. No other discrete or worrisome osseous lesions. Mild-to-moderate multilevel cervical spondylosis, most pronounced at C6-7. Upper chest: Visualized upper chest demonstrates no acute finding. Partially visualized lungs are clear. Other: None.  IMPRESSION: 1. 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 Finding is highly concerning for underlying malignancy, with a primary nasopharyngeal carcinoma being the primary differential consideration. This lesion is almost certainly highly aggressive in nature as these changes appear largely new as compared to recent temporal bone CT from 09/07/2019. ENT referral for further workup and consultation recommended. 2. Asymmetric prominence of right-sided cervical lymph nodes as compared to the left, nonspecific and may be reactive. Possible nodal metastases not excluded. 3. Asymmetric hyperdensity and irregularity involving the right parotid gland, which could be related to direct tumor invasion or possibly associated/concomitant parotitis. Correlation with symptomatology recommended. 4. Aortic Atherosclerosis (ICD10-I70.0). Electronically Signed   By: Jeannine Boga M.D.   On: 03/13/2020 21:51   DG Chest Portable 1 View  Result Date: 03/13/2020 CLINICAL DATA:  74 year old male with altered mental status. EXAM: PORTABLE CHEST 1 VIEW COMPARISON:  None. FINDINGS: The lungs are clear. There is no pleural effusion pneumothorax. The cardiac silhouette is within limits. Atherosclerotic calcification of the aortic arch. No acute osseous pathology. IMPRESSION: No active disease. Electronically Signed   By: Anner Crete M.D.   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 CONTRAST:  67m GADAVIST GADOBUTROL 1 MMOL/ML IV SOLN COMPARISON:  Correlation made with prior CT imaging FINDINGS: Brain: Inner ear structures demonstrate an unremarkable MR appearance. There is no abnormal enhancement within the internal auditory canals. No acute infarction or intracranial hemorrhage. There is no mass effect or edema. Prominence of the ventricles and sulci reflects generalized parenchymal volume loss. Patchy foci of T2 hyperintensity in the supratentorial white matter nonspecific but may reflect mild to moderate chronic microvascular ischemic changes. There is a thin extra-axial collection along the right cerebral convexity measuring up to  6 mm in thickness. This was hypoattenuating on the prior head CT. There is corresponding T1 hyperintensity and susceptibility along the temporal convexity, which may reflect more blood products. Vascular: Major vessel flow voids at the skull base are preserved. Skull and upper cervical spine: See below. Sinuses/Orbits: Paranasal sinuses are clear. The orbits are unremarkable. Other: Right mastoid and middle ear opacification. Suggestion of peripheral enhancement within the mastoid. Right external auditory canal is opacified. There is abnormal signal and enhancement superficial to the right mastoid extending anteriorly into the temporal scalp and masticator, carotid, and parapharyngeal spaces at the level of the nasopharynx. Small volume fluid superficial to the mastoid demonstrates peripheral enhancement. See saved images series 17. There is also involvement of the right posterolateral nasopharyngeal wall. Right ICA flow void is preserved in this region. Diminished nondominant right vertebral artery flow void in this region may reflect slow flow. There is abnormal marrow signal and enhancement at the skull base involving the clivus and occipital condyle on the right and likely involving the right jugular and hypoglossal foramina. There  is abnormal signal of the included right mandibular condyle, which appears eroded. Additional erosive changes are better seen CT. Right sigmoid sinus is patent. IMPRESSION: Similar findings to prior neck CT detailed above involving right mastoid and middle ear, masticator/carotid/parapharyngeal spaces, skull base, and right mandibular condyle. Although malignancy is a consideration, severe infection in the setting of a diabetic patient is favored. Peripheral enhancement within the mastoid as well as about small volume fluid superficial to the mastoid may reflect abscess formation. Thin right cerebral convexity subdural hematoma without significant mass effect. Suspected more subacute blood products along the right temporal convexity; empyema is considered less likely given presence of corresponding susceptibility which reflects blood products. Electronically Signed   By: Macy Mis M.D.   On: 03/14/2020 13:51    Assessment:   Madyx Delfin is a 74 y.o. male with well controlled DM, long standing issues with R ear and otitis externa s/p surgery to remove polyps now with skull osteomyelitis. He has been seen by ENT and declines surgery. Will need long term I V abx.  I have discussed this with patient. Prior cx in Feb with Pseudomonas. Current cx pending.   Recommendations Place PICC line Continue zosyn pending culture results Will need home health arranged Check esr crp. Will need prolonged IV antibiotics since has declined surgery  Thank you very much for allowing me to participate in the care of this patient. Please call with questions.   Cheral Marker. Ola Spurr, MD

## 2020-03-15 NOTE — Consult Note (Signed)
WOC Nurse Consult Note: Reason for Consult: right ear with drainage. Seen by Otolaryngology/ENT Dr. Jenne Campus this morning and POC will be with drops and antibiotics.  Follow up with [facility].  I am requested for clarification of dressing order. Wound type: Infectious Pressure Injury:  No Dressing procedure/placement/frequency: Cleanse ear twice daily with NS, pat dry. Apply dry dressing.  Change PRN for drainage strike-through onto exterior of dressing.  WOC nursing team will not follow, but will remain available to this patient, the nursing and medical teams.  Please re-consult if needed. Thanks, Ladona Mow, MSN, RN, GNP, [facility]  Pager# [phone]

## 2020-03-15 NOTE — Progress Notes (Addendum)
Central tele called. Pt had 3 beat run of VT. Opyd-MD notified via epic txt page. Will cont to monitor.

## 2020-03-15 NOTE — Progress Notes (Signed)
Palliative:  Consult received and chart reviewed. Patient conversations with Dr. Tami Ribas regarding goals of care noted. Briefly met patient - he would like his wife to be present for full goals of care conversation. Spoke with wife Inez Catalina via phone - will not be at the hospital until much later. Agrees to meeting tomorrow 7/30 at 10 AM.   Thank you for involving the palliative medicine team in Daniel Sanders's care.   Juel Burrow, DNP, AGNP-C Palliative Medicine Team Team Phone # [phone]  Pager # [phone]   NO CHARGE

## 2020-03-15 NOTE — Progress Notes (Signed)
03/15/2020 10:09 AM  Daniel Sanders [id number]  Hospital day 2.  He is feeling better this am. No pain awake alert and eating breakfast.      Temp:  [97.5 F (36.4 C)-98 F (36.7 C)] 98 F (36.7 C) (07/29 0829) Pulse Rate:  [78-87] 78 (07/29 0829) Resp:  [16-18] 16 (07/29 0829) BP: (115-149)/(69-80) 127/80 (07/29 0829) SpO2:  [95 %-100 %] 100 % (07/29 0829),     Intake/Output Summary (Last 24 hours) at 03/15/2020 1009 Last data filed at 03/15/2020 0525 Gross per 24 hour  Intake 736.34 ml  Output 1850 ml  Net -1113.66 ml    Results for orders placed or performed during the hospital encounter of 03/13/20 (from the past 24 hour(s))  Glucose, capillary     Status: Abnormal   Collection Time: 03/14/20  6:12 PM  Result Value Ref Range   Glucose-Capillary 250 (H) 70 - 99 mg/dL  Hemoglobin K8L     Status: Abnormal   Collection Time: 03/14/20  6:24 PM  Result Value Ref Range   Hgb A1c MFr Bld 6.8 (H) 4.8 - 5.6 %   Mean Plasma Glucose 148.46 mg/dL  Glucose, capillary     Status: Abnormal   Collection Time: 03/14/20  8:40 PM  Result Value Ref Range   Glucose-Capillary 294 (H) 70 - 99 mg/dL   Comment 1 Notify RN   Glucose, capillary     Status: Abnormal   Collection Time: 03/14/20  9:01 PM  Result Value Ref Range   Glucose-Capillary 298 (H) 70 - 99 mg/dL  Comprehensive metabolic panel     Status: Abnormal   Collection Time: 03/15/20  4:32 AM  Result Value Ref Range   Sodium 134 (L) 135 - 145 mmol/L   Potassium 4.0 3.5 - 5.1 mmol/L   Chloride 96 (L) 98 - 111 mmol/L   CO2 26 22 - 32 mmol/L   Glucose, Bld 265 (H) 70 - 99 mg/dL   BUN 12 8 - 23 mg/dL   Creatinine, Ser 2.75 0.61 - 1.24 mg/dL   Calcium 8.0 (L) 8.9 - 10.3 mg/dL   Total Protein 7.8 6.5 - 8.1 g/dL   Albumin 2.7 (L) 3.5 - 5.0 g/dL   AST 17 15 - 41 U/L   ALT 19 0 - 44 U/L   Alkaline Phosphatase 89 38 - 126 U/L   Total Bilirubin 0.6 0.3 - 1.2 mg/dL   GFR calc non Af Amer >60 >60 mL/min   GFR calc Af Amer >60 >60  mL/min   Anion gap 12 5 - 15  CBC     Status: Abnormal   Collection Time: 03/15/20  4:32 AM  Result Value Ref Range   WBC 9.0 4.0 - 10.5 K/uL   RBC 4.20 (L) 4.22 - 5.81 MIL/uL   Hemoglobin 11.3 (L) 13.0 - 17.0 g/dL   HCT 17.0 (L) 39 - 52 %   MCV 86.0 80.0 - 100.0 fL   MCH 26.9 26.0 - 34.0 pg   MCHC 31.3 30.0 - 36.0 g/dL   RDW 01.7 49.4 - 49.6 %   Platelets 391 150 - 400 K/uL   nRBC 0.0 0.0 - 0.2 %  Magnesium     Status: None   Collection Time: 03/15/20  4:50 AM  Result Value Ref Range   Magnesium 2.0 1.7 - 2.4 mg/dL  Glucose, capillary     Status: Abnormal   Collection Time: 03/15/20  8:31 AM  Result Value Ref Range   Glucose-Capillary 236 (H)  70 - 99 mg/dL    SUBJECTIVE:  Overall feeling better.  Denies pain.  He thinks his ear is draining a bit more  OBJECTIVE:  Right ear is draining more with yellow discharge.  The swelling has decreased.   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 Shanna Cisco, Otologist at [facility] review them; he had seen Daniel Sanders previously.  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  This requires aggressive/urgent surgical debridement of the skull base and ear canal.  In his experience, the long term survival rate is low but surgery is the best treatment with prolonged IV antibiotic therapy.  He agreed to accept Daniel Sanders in transfer, but no beds were available.  I spoke with Daniel Sanders yesterday, and his wife, he was reluctant to have any surgery done.  His wife was going to speak with him and family to decide.  When I spoke with Daniel Sanders this morning, he is refusing any surgery, but would proceed with antibiotic therapy and see how things progress.  I advised him of the seriousness of this infection and the high mortality risk.  He seemed to fully understand and does not wish to have any surgery performed.   I will respect his wishes.  So far the culture is growing Pseudomonas sensitive to Zosyn.  I've spoken with the Dr. Allena Katz with hospitalist service and with ID consult.  I think the plan is for pic line placement and eventual outpatient therapy.  I think topical  drops would also be of benefit.  ID agrees.  I will follow and I will contact Dr. Manson Passey at [facility] of Daniel Sanders wishes.    Davina Poke 03/15/2020, 10:09 AM

## 2020-03-16 DIAGNOSIS — Z7189 Other specified counseling: Secondary | ICD-10-CM

## 2020-03-16 DIAGNOSIS — Z515 Encounter for palliative care: Secondary | ICD-10-CM

## 2020-03-16 LAB — GLUCOSE, CAPILLARY
Glucose-Capillary: 234 mg/dL — ABNORMAL HIGH (ref 70–99)
Glucose-Capillary: 242 mg/dL — ABNORMAL HIGH (ref 70–99)
Glucose-Capillary: 336 mg/dL — ABNORMAL HIGH (ref 70–99)
Glucose-Capillary: 209 mg/dL — ABNORMAL HIGH (ref 70–99)

## 2020-03-16 LAB — ANGIOTENSIN CONVERTING ENZYME: Angiotensin-Converting Enzyme: 11 U/L — ABNORMAL LOW (ref 14–82)

## 2020-03-16 LAB — MPO/PR-3 (ANCA) ANTIBODIES
ANCA Proteinase 3: <3.5 U/mL (ref 0.0–3.5)
Myeloperoxidase Abs: <9 U/mL (ref 0.0–9.0)

## 2020-03-16 LAB — CREATININE, SERUM
Creatinine, Ser: 0.73 mg/dL (ref 0.61–1.24)
GFR calc Af Amer: >60 mL/min (ref >60)
GFR calc non Af Amer: >60 mL/min (ref >60)

## 2020-03-16 LAB — EPSTEIN BARR VRS(EBV DNA BY PCR)
EBV DNA QN by PCR: 138 copies/mL
log10 EBV DNA Qn PCR: 2.14 log10 copy/mL

## 2020-03-16 LAB — C-REACTIVE PROTEIN: CRP: 3.6 mg/dL — ABNORMAL HIGH (ref <1.0)

## 2020-03-16 LAB — SEDIMENTATION RATE: Sed Rate: 103 mm/hr — ABNORMAL HIGH (ref 0–20)

## 2020-03-16 MED ORDER — DEXAMETHASONE SODIUM PHOSPHATE 4 MG/ML IJ SOLN
4.0000 mg | Freq: Once | INTRAMUSCULAR | Status: AC
  Administered 2020-03-16: 4 mg via INTRAVENOUS
  Filled 2020-03-16: qty 1

## 2020-03-16 MED ORDER — PREDNISONE 50 MG PO TABS
50.0000 mg | ORAL_TABLET | Freq: Every day | ORAL | Status: DC
  Administered 2020-03-19: 50 mg via ORAL
  Filled 2020-03-16: qty 1

## 2020-03-16 MED ORDER — PREDNISONE 20 MG PO TABS: 40.0000 mg | ORAL_TABLET | Freq: Every day | ORAL | Status: DC

## 2020-03-16 MED ORDER — INSULIN GLARGINE 100 UNIT/ML ~~LOC~~ SOLN
35.0000 [IU] | Freq: Two times a day (BID) | SUBCUTANEOUS | Status: DC
  Administered 2020-03-16: 35 [IU] via SUBCUTANEOUS
  Filled 2020-03-16 (×3): qty 0.35

## 2020-03-16 MED ORDER — PREDNISONE 20 MG PO TABS: 20.0000 mg | ORAL_TABLET | Freq: Every day | ORAL | Status: DC

## 2020-03-16 MED ORDER — PREDNISONE 50 MG PO TABS
60.0000 mg | ORAL_TABLET | Freq: Every day | ORAL | Status: AC
  Administered 2020-03-17 – 2020-03-18 (×2): 60 mg via ORAL
  Filled 2020-03-16 (×4): qty 1

## 2020-03-16 MED ORDER — SODIUM CHLORIDE 0.9 % IV SOLN
INTRAVENOUS | Status: DC | PRN
  Administered 2020-03-16: 1000 mL via INTRAVENOUS
  Administered 2020-03-17: 10 mL via INTRAVENOUS
  Administered 2020-03-19: 250 mL via INTRAVENOUS

## 2020-03-16 MED ORDER — PREDNISONE 10 MG PO TABS: 10.0000 mg | ORAL_TABLET | Freq: Every day | ORAL | Status: DC

## 2020-03-16 MED ORDER — SODIUM CHLORIDE 0.9% FLUSH
10.0000 mL | Freq: Two times a day (BID) | INTRAVENOUS | Status: DC
  Administered 2020-03-16 – 2020-03-19 (×6): 10 mL

## 2020-03-16 MED ORDER — PREDNISONE 50 MG PO TABS
60.0000 mg | ORAL_TABLET | Freq: Every day | ORAL | Status: DC
  Filled 2020-03-16: qty 1

## 2020-03-16 MED ORDER — INSULIN ASPART 100 UNIT/ML ~~LOC~~ SOLN
8.0000 [IU] | Freq: Three times a day (TID) | SUBCUTANEOUS | Status: DC
  Administered 2020-03-16 – 2020-03-17 (×3): 8 [IU] via SUBCUTANEOUS
  Filled 2020-03-16 (×3): qty 1

## 2020-03-16 MED ORDER — PREDNISONE 20 MG PO TABS: 30.0000 mg | ORAL_TABLET | Freq: Every day | ORAL | Status: DC

## 2020-03-16 MED ORDER — SODIUM CHLORIDE 0.9% FLUSH: 10.0000 mL | INTRAVENOUS | Status: DC | PRN

## 2020-03-16 MED ORDER — CHLORHEXIDINE GLUCONATE CLOTH 2 % EX PADS
6.0000 | MEDICATED_PAD | Freq: Every day | CUTANEOUS | Status: DC
  Administered 2020-03-16 – 2020-03-19 (×3): 6 via TOPICAL

## 2020-03-16 NOTE — Progress Notes (Signed)
03/16/2020 5:42 PM  Daniel Sanders [id number]  Hospital day 3.  He feels good has no major complaints is not having any pain or headache.  Questionable witnessed episode of seizure activity this morning.  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  Other than that he has no new symptoms.    Temp:  [97.7 F (36.5 C)-97.9 F (36.6 C)] 97.7 F (36.5 C) (07/30 1513) Pulse Rate:  [71-91] 80 (07/30 1513) Resp:  [15-20] 20 (07/30 1513) BP: (111-158)/(67-97) 127/81 (07/30 1513) SpO2:  [96 %-100 %] 98 % (07/30 1513),     Intake/Output Summary (Last 24 hours) at 03/16/2020 1742 Last data filed at 03/16/2020 1512 Gross per 24 hour  Intake 1498.32 ml  Output 1975 ml  Net -476.68 ml    Results for orders placed or performed during the hospital encounter of 03/13/20 (from the past 24 hour(s))  Glucose, capillary     Status: Abnormal   Collection Time: 03/15/20  9:27 PM  Result Value Ref Range   Glucose-Capillary 233 (H) 70 - 99 mg/dL  Creatinine, serum     Status: None   Collection Time: 03/16/20  5:01 AM  Result Value Ref Range   Creatinine, Ser 0.73 0.61 - 1.24 mg/dL   GFR calc non Af Amer >60 >60 mL/min   GFR calc Af Amer >60 >60 mL/min  Sedimentation rate     Status: Abnormal   Collection Time: 03/16/20  5:01 AM  Result Value Ref Range   Sed Rate 103 (H) 0 - 20 mm/hr  C-reactive protein     Status: Abnormal   Collection Time: 03/16/20  5:01 AM  Result Value Ref Range   CRP 3.6 (H) <1.0 mg/dL  Glucose, capillary     Status: Abnormal   Collection Time: 03/16/20  5:23 AM  Result Value Ref Range   Glucose-Capillary 234 (H) 70 - 99 mg/dL   Comment 1 Notify RN   Glucose, capillary     Status: Abnormal   Collection Time: 03/16/20  7:35 AM  Result Value Ref Range   Glucose-Capillary 242 (H) 70 - 99 mg/dL  Glucose, capillary      Status: Abnormal   Collection Time: 03/16/20 10:31 AM  Result Value Ref Range   Glucose-Capillary 336 (H) 70 - 99 mg/dL  Glucose, capillary     Status: Abnormal   Collection Time: 03/16/20  4:37 PM  Result Value Ref Range   Glucose-Capillary 209 (H) 70 - 99 mg/dL    SUBJECTIVE: He is feeling fine has no complaints no headache no pain.  OBJECTIVE: The external canal is still swollen the drainage has decreased there is no significant swelling around the parotid region.  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  As he is refused surgery I agree with the plan of long-term IV antibiotic therapy.  Dr. Sampson Goon has seen him with infectious disease a plan has been been made and a PICC line has been placed for home administration.  Depending on discharge I would like to see him back in the office in a couple of weeks reassess the ear at that time.  We will likely repeat his CT scan to see how he is progressing with this infection.  PLAN: I will continue to follow along while in the hospital will follow  up with him as an outpatient after discharge.  He has refused any surgical intervention therefore we will proceed with IV antibiotics and follow along closely.  He understands the seriousness of this infection, and wishes to proceed with IV antibiotic therapy.  Would also recommend he be discharged home on the tobramycin drops in addition to the IV Zosyn.  Davina Poke 03/16/2020, 5:42 PM

## 2020-03-16 NOTE — Progress Notes (Signed)
[place] INFECTIOUS DISEASE PROGRESS NOTE Date of Admission:  03/13/2020     ID: Daniel Sanders is a 74 y.o. 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  ROS  Eleven systems are reviewed and negative except per hpi  Medications:  Antibiotics Given (last 72 hours)    Date/Time Action Medication Dose Rate   03/14/20 0050 Given   ciprofloxacin (CIPRO) tablet 500 mg 500 mg    03/14/20 0052 New Bag/Given   Ampicillin-Sulbactam (UNASYN) 3 g in sodium chloride 0.9 % 100 mL IVPB 3 g 200 mL/hr   03/14/20 0934 New Bag/Given   Ampicillin-Sulbactam (UNASYN) 3 g in sodium chloride 0.9 % 100 mL IVPB 3 g 200 mL/hr   03/14/20 8299 Given   ciprofloxacin (CIPRO) tablet 500 mg 500 mg    03/14/20 1304 New Bag/Given   piperacillin-tazobactam (ZOSYN) IVPB 3.375 g 3.375 g 12.5 mL/hr   03/14/20 1330 New Bag/Given   vancomycin (VANCOCIN) 2,500 mg in sodium chloride 0.9 % 500 mL IVPB 2,500 mg 250 mL/hr   03/14/20 2106 New Bag/Given   piperacillin-tazobactam (ZOSYN) IVPB 3.375 g 3.375 g 12.5 mL/hr   03/15/20 0105 New Bag/Given   vancomycin (VANCOREADY) IVPB 1500 mg/300 mL 1,500 mg 150 mL/hr   03/15/20 0518 New Bag/Given   piperacillin-tazobactam (ZOSYN) IVPB 3.375 g 3.375 g 12.5 mL/hr   03/15/20 1656 New Bag/Given   piperacillin-tazobactam (ZOSYN) IVPB 3.375 g 3.375 g 12.5 mL/hr   03/15/20 1702 New Bag/Given   vancomycin (VANCOREADY) IVPB 1500 mg/300 mL 1,500 mg 150 mL/hr   03/16/20 0017 New Bag/Given   piperacillin-tazobactam (ZOSYN) IVPB 3.375 g 3.375 g 12.5 mL/hr   03/16/20 0513 New Bag/Given   vancomycin (VANCOREADY) IVPB 1500 mg/300 mL 1,500 mg 150 mL/hr   03/16/20 0900 New Bag/Given   piperacillin-tazobactam (ZOSYN)  IVPB 3.375 g 3.375 g 12.5 mL/hr     . amLODipine  5 mg Oral Daily  . aspirin EC  81 mg Oral Daily  . atorvastatin  20 mg Oral QHS  . Chlorhexidine Gluconate Cloth  6 each Topical Daily  . dexamethasone (DECADRON) injection  4 mg Intravenous Q8H  . enoxaparin (LOVENOX) injection  40 mg Subcutaneous Q12H  . furosemide  40 mg Oral QPM  . insulin aspart  0-20 Units Subcutaneous TID WC  . insulin aspart  0-5 Units Subcutaneous QHS  . insulin aspart  8 Units Subcutaneous TID WC  . insulin glargine  35 Units Subcutaneous BID  . lisinopril  40 mg Oral BID  . sodium chloride flush  10-40 mL Intracatheter Q12H  . tobramycin-dexamethasone  4 drop Right EAR BID  . vitamin B-12  1,000 mcg Oral Daily    Objective: Vital signs in last 24 hours: Temp:  [97.8 F (36.6 C)-97.9 F (36.6 C)] 97.8 F (36.6 C) (07/30 0737) Pulse Rate:  [71-91] 91 (07/30 1031) Resp:  [15-18] 17 (07/30 0737) BP: (111-158)/(67-97) 158/97 (07/30 1031) SpO2:  [96 %-100 %] 100 % (07/30 1031) Constitutional: He is oriented to person, place, and time. He appears well-developed and well-nourished. No distress.  HENT: R ear with drainage and thick crusting Mouth/Throat: Oropharynx is clear and moist. No oropharyngeal exudate.  Cardiovascular: Normal rate, regular rhythm and normal heart sounds. Pulmonary/Chest:  Effort normal and breath sounds normal. No respiratory distress. He has no wheezes.  Abdominal: Soft. Bowel sounds are normal. He exhibits no distension. There is no tenderness.  Lymphadenopathy:  He has no cervical adenopathy.  Neurological: He is alert and oriented to person, place, and time.  Skin: Skin is warm and dry. No rash noted. No erythema.  Psychiatric: He has a normal mood and affect. His behavior is normal.    Lab Results Recent Labs    03/14/20 0457 03/14/20 0457 03/15/20 0432 03/16/20 0501  WBC 5.6  --  9.0  --   HGB 11.2*  --  11.3*  --   HCT 35.4*  --  36.1*  --   NA 132*  --  134*  --    K 4.7  --  4.0  --   CL 97*  --  96*  --   CO2 26  --  26  --   BUN 12  --  12  --   CREATININE 0.60*   < > 0.76 0.73   < > = values in this interval not displayed.    Microbiology: Results for orders placed or performed during the hospital encounter of 03/13/20  Blood culture (routine x 2)     Status: None (Preliminary result)   Collection Time: 03/13/20  7:37 PM   Specimen: BLOOD  Result Value Ref Range Status   Specimen Description BLOOD BLOOD RIGHT WRIST  Final   Special Requests   Final    BOTTLES DRAWN AEROBIC AND ANAEROBIC Blood Culture results may not be optimal due to an inadequate volume of blood received in culture bottles   Culture   Final    NO GROWTH 3 DAYS Performed at [facility], [address] [place], [place] [address]    Report Status PENDING  Incomplete  Blood culture (routine x 2)     Status: None (Preliminary result)   Collection Time: 03/13/20  7:42 PM   Specimen: BLOOD  Result Value Ref Range Status   Specimen Description BLOOD LEFT ANTECUBITAL  Final   Special Requests   Final    BOTTLES DRAWN AEROBIC AND ANAEROBIC Blood Culture results may not be optimal due to an inadequate volume of blood received in culture bottles   Culture   Final    NO GROWTH 3 DAYS Performed at [facility], [address], [place], [place] [address]    Report Status PENDING  Incomplete  SARS Coronavirus 2 by RT PCR (hospital order, performed in [place] hospital lab) Nasopharyngeal Nasopharyngeal Swab     Status: None   Collection Time: 03/13/20 11:05 PM   Specimen: Nasopharyngeal Swab  Result Value Ref Range Status   SARS Coronavirus 2 NEGATIVE NEGATIVE Final    Comment: (NOTE) SARS-CoV-2 target nucleic acids are NOT DETECTED.  The SARS-CoV-2 RNA is generally detectable in upper and lower respiratory specimens during the acute phase of infection. The lowest concentration of SARS-CoV-2 viral copies this assay can detect is 250 copies / mL. A  negative result does not preclude SARS-CoV-2 infection and should not be used as the sole basis for treatment or other patient management decisions.  A negative result may occur with improper specimen collection / handling, submission of specimen other than nasopharyngeal swab, presence of viral mutation(s) within the areas targeted by this assay, and inadequate number of viral copies (<250 copies / mL). A negative result must be combined with clinical observations, patient history, and epidemiological information.  Fact Sheet for Patients:   StrictlyIdeas.no  Fact Sheet for Healthcare Providers: BankingDealers.co.za  This test is not yet approved or  cleared by the [place] FDA and has been authorized for detection and/or diagnosis of SARS-CoV-2 by FDA under an Emergency Use Authorization (EUA).  This EUA will remain in effect (meaning this test can be used) for the duration of the COVID-19 declaration under Section 564(b)(1) of the Act, 21 U.S.C. section 360bbb-3(b)(1), unless the authorization is terminated or revoked sooner.  Performed at [facility], [address] [place], [place] [address]   Aerobic/Anaerobic Culture (surgical/deep wound)     Status: None (Preliminary result)   Collection Time: 03/14/20  8:17 AM   Specimen: Ear  Result Value Ref Range Status   Specimen Description   Final    EAR RIGHT Performed at [facility], [address], [place], [place] [address]    Special Requests   Final    NONE Performed at [facility], [address] [place], [place] [address]    Gram Stain   Final    RARE WBC PRESENT,BOTH PMN AND MONONUCLEAR NO ORGANISMS SEEN Performed at [facility], [place] [address], [place], [place] [address]    Culture   Final    RARE PSEUDOMONAS AERUGINOSA SUSCEPTIBILITIES TO FOLLOW NO ANAEROBES ISOLATED; CULTURE IN PROGRESS FOR 5 DAYS    Report Status  PENDING  Incomplete    Studies/Results: [place] EKG SITE RITE  Result Date: 03/15/2020 If Site Rite image not attached, placement could not be confirmed due to current cardiac rhythm.   Assessment/Plan: Daniel Sanders is a 74 y.o. male with well controlled DM, long standing issues with R ear and otitis externa s/p surgery to remove polyps now with skull osteomyelitis. He has been seen by ENT and declines surgery. Will need long term I V abx.  I have discussed this with patient. Prior cx in Feb with Pseudomonas. Current cx pending. ESR 103, CRP 3.6  Recommendations Place PICC line Continue zosyn pending culture results Will need home health arranged Will follow esr and crp and imaging to guide duration of abx therapy.  Will need prolonged IV antibiotics since has declined surgery  Thank you very much for allowing me to participate in the care of this patient. Please call with questions.  Thank you very much for the consult. Will follow with you.  Leonel Ramsay   03/16/2020, 2:19 PM

## 2020-03-16 NOTE — Progress Notes (Signed)
Patient said he will take a bathe later on today

## 2020-03-16 NOTE — TOC Progression Note (Signed)
Transition of Care [facility]) - Progression Note    Patient Details  Name: [name] MRN: [id number] Date of Birth: 14-Jul-1946  Transition of Care [facility]) CM/SW Contact  Barrie Dunker, RN Phone Number: 03/16/2020, 2:01 PM  Clinical Narrative:   Spoke with the patient and his wife about DC plan and needs He lives at home with his wife, He needs a RW and a 3 in 1, he is 6 ft 2, I notified zack with [facility]  He will benefit from hh PT,OT, nurse and aide, I set [facility] up with [facility] He is [facility] with [facility] for the IV ABX  He is up to date with his doctors and he can afford his medication No additional needs          Expected Discharge Plan and Services                                                 Social Determinants of Health (SDOH) Interventions    Readmission Risk Interventions No flowsheet data found.

## 2020-03-16 NOTE — Progress Notes (Signed)
Infectious Disease Long Term IV Antibiotic Orders Daniel Sanders 12-02-1945  Diagnosis: Skull base osteo and otitis externa   Culture results Results for orders placed or performed during the hospital encounter of 03/13/20  Blood culture (routine x 2)     Status: None (Preliminary result)   Collection Time: 03/13/20  7:37 PM   Specimen: BLOOD  Result Value Ref Range Status   Specimen Description BLOOD BLOOD RIGHT WRIST  Final   Special Requests   Final    BOTTLES DRAWN AEROBIC AND ANAEROBIC Blood Culture results may not be optimal due to an inadequate volume of blood received in culture bottles   Culture   Final    NO GROWTH 3 DAYS Performed at [facility], [address], [place], [place] [address]    Report Status PENDING  Incomplete  Blood culture (routine x 2)     Status: None (Preliminary result)   Collection Time: 03/13/20  7:42 PM   Specimen: BLOOD  Result Value Ref Range Status   Specimen Description BLOOD LEFT ANTECUBITAL  Final   Special Requests   Final    BOTTLES DRAWN AEROBIC AND ANAEROBIC Blood Culture results may not be optimal due to an inadequate volume of blood received in culture bottles   Culture   Final    NO GROWTH 3 DAYS Performed at [facility], [address], [place], [place] [address]    Report Status PENDING  Incomplete  SARS Coronavirus 2 by RT PCR (hospital order, performed in [facility] hospital lab) Nasopharyngeal Nasopharyngeal Swab     Status: None   Collection Time: 03/13/20 11:05 PM   Specimen: Nasopharyngeal Swab  Result Value Ref Range Status   SARS Coronavirus 2 NEGATIVE NEGATIVE Final    Comment: (NOTE) SARS-CoV-2 target nucleic acids are NOT DETECTED.  The SARS-CoV-2 RNA is generally detectable in upper and lower respiratory specimens during the acute phase of infection. The lowest concentration of SARS-CoV-2 viral copies this assay can detect is 250 copies / mL. A negative result does not preclude SARS-CoV-2  infection and should not be used as the sole basis for treatment or other patient management decisions.  A negative result may occur with improper specimen collection / handling, submission of specimen other than nasopharyngeal swab, presence of viral mutation(s) within the areas targeted by this assay, and inadequate number of viral copies (<250 copies / mL). A negative result must be combined with clinical observations, patient history, and epidemiological information.  Fact Sheet for Patients:   BoilerBrush.com.cy  Fact Sheet for Healthcare Providers: https://pope.com/  This test is not yet approved or  cleared by the [place] FDA and has been authorized for detection and/or diagnosis of SARS-CoV-2 by FDA under an Emergency Use Authorization (EUA).  This EUA will remain in effect (meaning this test can be used) for the duration of the COVID-19 declaration under Section 564(b)(1) of the Act, 21 U.S.C. section 360bbb-3(b)(1), unless the authorization is terminated or revoked sooner.  Performed at [facility], [address] [place], [place] [address]   Aerobic/Anaerobic Culture (surgical/deep wound)     Status: None (Preliminary result)   Collection Time: 03/14/20  8:17 AM   Specimen: Ear  Result Value Ref Range Status   Specimen Description   Final    EAR RIGHT Performed at [facility], [address], [place], [place] [address]    Special Requests   Final    NONE Performed at [facility], [address] [place], [place] [address]    Gram Stain   Final    RARE WBC PRESENT,BOTH PMN AND MONONUCLEAR NO ORGANISMS SEEN Performed at [facility], [address], [place], [place] [address]    Culture   Final    RARE PSEUDOMONAS AERUGINOSA SUSCEPTIBILITIES TO FOLLOW NO ANAEROBES ISOLATED; CULTURE IN PROGRESS FOR 5 DAYS    Report Status PENDING  Incomplete     LABS Lab Results   Component Value Date   CREATININE 0.73 03/16/2020   Lab Results  Component Value Date   WBC 9.0 03/15/2020   HGB 11.3 (L) 03/15/2020   HCT 36.1 (L) 03/15/2020   MCV 86.0 03/15/2020   PLT 391 03/15/2020   Lab Results  Component Value Date   ESRSEDRATE 103 (H) 03/16/2020   Lab Results  Component Value Date   CRP 3.6 (H) 03/16/2020    Allergies: No Known Allergies  Discharge antibiotics Zosyn 13.5  grams continuous every 24 hours   PICC Care per protocol Labs weekly while on IV antibiotics -FAX weekly labs to [phone] CBC w diff   Comprehensive  CRP   Planned duration of antibiotics  Stop date Sept 10th  Follow up clinic date 3 weeks   Mick Sell, MD

## 2020-03-16 NOTE — Evaluation (Signed)
Occupational Therapy Evaluation Patient Details Name: Daniel Sanders MRN: [id number] DOB: 1945/12/02 Today's Date: 03/16/2020    History of Present Illness Mavis Fichera  is a 74 y.o. 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 intake for the last week. 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   Clinical Impression   Mr Holzhauer was seen for OT evaluation this date. Prior to hospital admission, pt was MOD I for [facility] and endorses hx of falls. Pt lives c wife. Pt presents to acute OT demonstrating impaired ADL performance and functional mobility 2/2 decreased activity tolerance, functional strength/balance deficits, and decreased insight into deficits. Pt currently requires MOD I for bed mobility. SBA + RW for BSC t/f. SBA + increased time don B socks seated EOB. MOD I simulated UBD. Pt would benefit from skilled OT to address noted impairments and functional limitations (see below for any additional details) in order to maximize safety and independence while minimizing falls risk and caregiver burden. Upon hospital discharge, recommend HHOT to maximize pt safety and return to functional independence during meaningful occupations of daily life.     Follow Up Recommendations  Home health OT    Equipment Recommendations  3 in 1 bedside commode    Recommendations for Other Services       Precautions / Restrictions Precautions Precautions: Fall Restrictions Weight Bearing Restrictions: No      Mobility Bed Mobility Overal bed mobility: Modified Independent              General bed mobility comments: Rails + HoB elevated  Transfers Overall transfer level: Needs assistance Equipment used: Rolling walker (2 wheeled) Transfers: Sit to/from Stand Sit to Stand: Supervision         General transfer comment: SBA + RW sit<>stand and ~ 4 steps in room     Balance Overall balance assessment: Needs assistance Sitting-balance support: No upper extremity supported;Feet supported Sitting balance-Leahy Scale: Good     Standing balance support: Bilateral upper extremity supported;During functional activity Standing balance-Leahy Scale: Good                             ADL either performed or assessed with clinical judgement   ADL Overall ADL's : Needs assistance/impaired                                       General ADL Comments: SBA + RW for BSC t/f. SBA + increased time don B socks seated EOB. MOD I simulated UBD     Vision         Perception     Praxis      Pertinent Vitals/Pain Pain Assessment: No/denies pain     Hand Dominance     Extremity/Trunk Assessment Upper Extremity Assessment Upper Extremity Assessment: Overall WFL for tasks assessed   Lower Extremity Assessment Lower Extremity Assessment: Generalized weakness       Communication Communication Communication: No difficulties   Cognition Arousal/Alertness: Awake/alert Behavior During Therapy: WFL for  tasks assessed/performed Overall Cognitive Status: Within Functional Limits for tasks assessed                                     General Comments       Exercises Exercises: Other exercises Other Exercises Other Exercises: Pt educated re: OT role, DME recs, d/c recs, falls prevention, ECS, HEP Other Exercises: LBD, simulated UBD, sup<>sit, sit<>stand, sitting/standing balance/tolerance   Shoulder Instructions      Home Living Family/patient expects to be discharged to:: Private residence Living Arrangements:  Spouse/significant other Available Help at Discharge: Family;Available 24 hours/day Type of Home: House Home Access: Stairs to enter [employer] of Steps: 4 Entrance Stairs-Rails: Can reach both Home Layout: Two level (has basement)     Bathroom Shower/Tub: Tub/shower unit;Door (walk in tub - has door)   Bathroom Toilet: Handicapped height     Home Equipment: Environmental consultant - 2 wheels          Prior Functioning/Environment Level of Independence: Independent with assistive device(s)        Comments: Pt reports hx of falls, uses SPC for mobility.         OT Problem List: Decreased strength;Decreased activity tolerance;Impaired balance (sitting and/or standing);Decreased knowledge of use of DME or AE      OT Treatment/Interventions: Self-care/ADL training;Therapeutic exercise;Energy conservation;DME and/or AE instruction;Therapeutic activities;Patient/family education;Balance training    OT Goals(Current goals can be found in the care plan section) Acute Rehab OT Goals Patient Stated Goal: To return home OT Goal Formulation: With patient Time For Goal Achievement: 03/30/20 Potential to Achieve Goals: Good ADL Goals Pt Will Perform Grooming: with supervision;standing (c LRAD PRN) Pt Will Perform Lower Body Dressing: with modified independence;sit to/from stand (c LRAD PRN) Pt Will Transfer to Toilet: with modified independence;ambulating;bedside commode (c LRAD PRN)  OT Frequency: Min 1X/week   Barriers to D/C: Decreased caregiver support          Co-evaluation              AM-PAC OT "6 Clicks" Daily Activity     Outcome Measure Help from another person eating meals?: None Help from another person taking care of personal grooming?: None Help from another person toileting, which includes using toliet, bedpan, or urinal?: A Little Help from another person bathing (including washing, rinsing, drying)?: A Little Help from another person to put on and taking  off regular upper body clothing?: None Help from another person to put on and taking off regular lower body clothing?: A Little 6 Click Score: 21   End of Session Equipment Utilized During Treatment: Rolling walker  Activity Tolerance: Patient tolerated treatment well Patient left: in bed;with call bell/phone within reach;with bed alarm set  OT Visit Diagnosis: Other abnormalities of gait and mobility (R26.89)                Time: 3810-1751 OT Time Calculation (min): 18 min Charges:  OT General Charges $OT Visit: 1 Visit OT Evaluation $OT Eval Low Complexity: 1 Low OT Treatments $Self Care/Home Management : 8-22 mins  Kathie Dike, M.S. OTR/L  03/16/20, 4:29 PM  ascom [phone]

## 2020-03-16 NOTE — Progress Notes (Signed)
Peripherally Inserted Central Catheter Placement  The IV Nurse has discussed with the patient and/or persons authorized to consent for the patient, the purpose of this procedure and the potential benefits and risks involved with this procedure.  The benefits include less needle sticks, lab draws from the catheter, and the patient may be discharged home with the catheter. Risks include, but not limited to, infection, bleeding, blood clot (thrombus formation), and puncture of an artery; nerve damage and irregular heartbeat and possibility to perform a PICC exchange if needed/ordered by physician.  Alternatives to this procedure were also discussed.  Bard Power PICC patient education guide, fact sheet on infection prevention and patient information card has been provided to patient /or left at bedside.    PICC Placement Documentation  PICC Single Lumen 03/16/20 PICC Right Basilic 43 cm 0 cm (Active)  Indication for Insertion or Continuance of Line Prolonged intravenous therapies 03/16/20 1350  Exposed Catheter (cm) 0 cm 03/16/20 1350  Site Assessment Clean;Dry;Intact 03/16/20 1350  Line Status Flushed;Blood return noted 03/16/20 1350  Dressing Type Transparent 03/16/20 1350  Dressing Status Clean;Dry;Intact;Antimicrobial disc in place;Other (Comment) 03/16/20 1350  Dressing Intervention New dressing 03/16/20 1350  Dressing Change Due 03/23/20 03/16/20 1350       Reginia Forts Albarece 03/16/2020, 1:51 PM

## 2020-03-16 NOTE — Progress Notes (Signed)
PHARMACY CONSULT NOTE FOR:  OUTPATIENT  PARENTERAL ANTIBIOTIC THERAPY (OPAT)  Indication: pseudomonas otitis externa and skull osteomyelitis Regimen: piperactillin/tazobactam 13.5gm IV as continuous infusion over 24h End date: 04/27/2020  IV antibiotic discharge orders are pended. To discharging provider:  please sign these orders via discharge navigator,  Select New Orders & click on the button choice - Manage This Unsigned Work.     Thank you for allowing pharmacy to be a part of this patient's care.  Juliette Alcide, PharmD, BCPS.   Work Cell: [phone] 03/16/2020 2:40 PM

## 2020-03-16 NOTE — Progress Notes (Signed)
Pharmacy Antibiotic Note  Daniel Sanders is a 74 y.o. male admitted on 03/13/2020 with necrotizing otitis externa. Pharmacy has been consulted for vancomycin and Zosyn dosing.  Pt received Vancomycin 2500 mg IV x 1 loading dose  Plan: Continue Vancomycin 1500 mg IV q12h. Will plan for vancomycin trough at steady state to ensure pt not accumulating doses.  Will order SCr for AM to continue to monitor renal function. SCr stable thus far.   Zosyn 3.375g IV q8h (4 hour infusion).  Height: 6\' 2"  (188 cm) Weight: (!) 144 kg (317 lb 7.4 oz) IBW/kg (Calculated) : 82.2  Temp (24hrs), Avg:97.8 F (36.6 C), Min:97.8 F (36.6 C), Max:97.9 F (36.6 C)  Recent Labs  Lab 03/13/20 1855 03/13/20 2108 03/14/20 0457 03/15/20 0432 03/16/20 0501  WBC 8.9  --  5.6 9.0  --   CREATININE 0.78  --  0.60* 0.76 0.73  LATICACIDVEN 2.7* 2.0*  --   --   --     Estimated Creatinine Clearance: 118.8 mL/min (by C-G formula based on SCr of 0.73 mg/dL).    No Known Allergies  Antimicrobials this admission: Ciprofloxacin 7/27 >> 7/28 Ceftriaxone 1g 7/27 >> 7/27 Unasyn 7/27 >> 7/28 Vancomycin 7/28 >>  Zosyn 7/28 >>  Dose adjustments this admission: N/A  Microbiology results: 7/27 BCx: NGTD 7/27 Body fluid cx from right ear: sent 7/28 aerobic/anaerobic cx from right ear: NGTD  Thank you for allowing pharmacy to be a part of this patient's care.  8/28, PharmD, BCPS Clinical Pharmacist 03/16/2020 1:05 PM

## 2020-03-16 NOTE — Plan of Care (Signed)

## 2020-03-16 NOTE — Care Management Important Message (Signed)
Important Message  Patient Details  Name: Daniel Sanders MRN: [id number] Date of Birth: 04-Oct-1945   Medicare Important Message Given:  Yes     Olegario Messier A Shauntay Brunelli 03/16/2020, 11:50 AM

## 2020-03-16 NOTE — Consult Note (Signed)
Consultation Note Date: 03/16/2020   Patient Name: Daniel Sanders  DOB: July 22, 1946  MRN: [id number]  Age / Sex: 74 y.o., male  PCP: System, Provider Not In Referring Physician: Fritzi Mandes, MD  Reason for Consultation: Establishing goals of care  HPI/Patient Profile: 74 y.o. 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 Per ENT, aggressive surgical debridement has been recommended. Long term survival of this condition is low per ENT. Mr. Manuele has declined surgery and is unsure about PICC placement for IV antibiotics. PMT consulted to discuss Colon.  Clinical Assessment and Goals of Care: I have reviewed medical records including EPIC notes, labs and imaging, received report from Dr. Posey Pronto, assessed the patient and then met with him and his wife, Inez Catalina,  to discuss diagnosis prognosis, GOC, EOL wishes, disposition and options.  I introduced Palliative Medicine as specialized medical care for people living with serious illness. It focuses on providing relief from the symptoms and stress of a serious illness. The goal is to improve quality of life for both the patient and the family.  We discussed a brief life review of the patient. 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 He tells me a lot about his spiritual beliefs and faith that God can heal him.   As far as functional and nutritional status, he tells me he uses a cane for ambulation to help with balance. He has had a couple of falls over the past few years. He has also had a 60 lb weight loss d/t decreased intake r/t being unable to wear his dentures because of pain r/t infection over the past 4-5 months.    We discussed patient's current illness and what it means in the larger context of  patient's on-going co-morbidities. Patient tells me he feels like he is getting better and he attributes this to IV antibiotics. He also expresses that he would like to go home with antibiotic therapy and proceed with PICC line insertion. We discuss recommendation for surgery. He understands recommendation but does not want to move forward with this. We discuss what he would want if he were to go home with antibiotic therapy and continue to decline - he states he still does not think he would want surgery; however, his family does want him to proceed with surgery and he is considering their wishes. Not agreeable at this time to move forward with surgery.  He again expresses his beliefs that God will heal him.   By expressing his desire to avoid surgery, patient is not interested in a comfort focused path - he expects that he will recover d/t his belief that God will heal him. He does request full code status and all other medical interventions offered to prolong life.   Discussed with patient/family the importance of continued conversation with family and the medical providers regarding overall plan of care and treatment options, ensuring decisions are within the context of the patient's values and GOCs.    Palliative Care services outpatient were explained and offered. Patient is agreeable to outpatient palliative following.  Questions and concerns were addressed. The family was encouraged to call with questions or concerns.   Midway through goals of care conversation patient stops and tells me "I feel it happening again" then begins to twitch in upper and lower extremities. Became lethargic  but would verbally respond to physical stimulation and follow commands. No loss of bowel or bladder control. Episode lasted several minutes. Patient less interactive following episode. Dr. Posey Pronto and RN called to bedside for further evaluation.  Primary Decision Maker PATIENT    SUMMARY OF RECOMMENDATIONS   -  wants to try home with IV antibiotics/home health - not interested in comfort focused care - right not he is hoping for total healing - he does understand that surgery has been recommended and has declined it - agreeable to outpatient palliative - full code  Prognosis:   Unable to determine  Discharge Planning: Home with Home Health and home palliative     Primary Diagnoses: Present on Admission: **None**   I have reviewed the medical record, interviewed the patient and family, and examined the patient. The following aspects are pertinent.  Past Medical History:  Diagnosis Date  . Arthritis   . Diabetes mellitus without complication ([place])   . Hyperlipidemia associated with type 2 diabetes mellitus ([place])   . Hypertension   . Obesity   . Sleep apnea    per patient, incorrect  . Venous insufficiency of both lower extremities 2021   Social History   Socioeconomic History  . Marital status: Married    Spouse name: Inez Catalina  . Number of children: Not on file  . Years of education: Not on file  . Highest education level: Not on file  Occupational History  . Occupation: Games developer    Comment: retired  Tobacco Use  . Smoking status: Former Smoker    Types: Cigarettes, Pipe  . Smokeless tobacco: Former Systems developer    Quit date: Biochemist, clinical  . Vaping Use: Never used  Substance and Sexual Activity  . Alcohol use: Never    Comment: nothing for 36 years  . Drug use: Not on file    Comment: nothing in over 30 years  . Sexual activity: Not on file  Other Topics Concern  . Not on file  Social History Narrative  . Not on file   Social Determinants of Health   Financial Resource Strain:   . Difficulty of Paying Living Expenses:   Food Insecurity:   . Worried About Charity fundraiser in the Last Year:   . Arboriculturist in the Last Year:   Transportation Needs:   . Film/video editor (Medical):   Marland Kitchen Lack of Transportation (Non-Medical):   Physical  Activity:   . Days of Exercise per Week:   . Minutes of Exercise per Session:   Stress:   . Feeling of Stress :   Social Connections:   . Frequency of Communication with Friends and Family:   . Frequency of Social Gatherings with Friends and Family:   . Attends Religious Services:   . Active Member of Clubs or Organizations:   . Attends Archivist Meetings:   Marland Kitchen Marital Status:    History reviewed. No pertinent family history. Scheduled Meds: . amLODipine  5 mg Oral Daily  . aspirin EC  81 mg Oral Daily  . atorvastatin  20 mg Oral QHS  . dexamethasone (DECADRON) injection  4 mg Intravenous Q8H  . enoxaparin (LOVENOX) injection  40 mg Subcutaneous Q12H  . furosemide  40 mg Oral QPM  . insulin aspart  0-20 Units Subcutaneous TID WC  . insulin aspart  0-5 Units Subcutaneous QHS  . insulin glargine  30 Units Subcutaneous BID  . lisinopril  40  mg Oral BID  . tobramycin-dexamethasone  4 drop Right EAR BID  . vitamin B-12  1,000 mcg Oral Daily   Continuous Infusions: . sodium chloride Stopped (03/16/20 0513)  . piperacillin-tazobactam (ZOSYN)  IV 3.375 g (03/16/20 0900)  . vancomycin 1,500 mg (03/16/20 0513)   PRN Meds:.sodium chloride, acetaminophen **OR** acetaminophen, HYDROcodone-acetaminophen, magnesium hydroxide, morphine injection, ondansetron **OR** ondansetron (ZOFRAN) IV, traZODone No Known Allergies Review of Systems  Constitutional: Positive for activity change and appetite change.    Physical Exam Constitutional:      General: He is not in acute distress. Pulmonary:     Effort: Pulmonary effort is normal.  Musculoskeletal:     Right lower leg: No edema.     Left lower leg: No edema.  Skin:    General: Skin is warm and dry.  Neurological:     Mental Status: He is alert and oriented to person, place, and time.  Psychiatric:        Mood and Affect: Mood normal.        Behavior: Behavior normal.     Vital Signs: BP (!) 131/80 (BP Location: Left Arm)    Pulse 71   Temp 97.8 F (36.6 C) (Oral)   Resp 17   Ht _0  (1.88 m)   Wt (!) 144 kg   SpO2 97%   BMI 40.76 kg/m  Pain Scale: 0-10   Pain Score: 0-No pain   SpO2: SpO2: 97 % O2 Device:SpO2: 97 % O2 Flow Rate: .   IO: Intake/output summary:   Intake/Output Summary (Last 24 hours) at 03/16/2020 0905 Last data filed at 03/16/2020 7341 Gross per 24 hour  Intake 968.99 ml  Output 1900 ml  Net -931.01 ml    LBM: Last BM Date: 03/12/20 Baseline Weight: Weight: (!) 144 kg Most recent weight: Weight: (!) 144 kg     Palliative Assessment/Data: PPS 60%    Time Total: 70 minutes Greater than 50%  of this time was spent counseling and coordinating care related to the above assessment and plan.  Juel Burrow, DNP, AGNP-C Palliative Medicine Team [phone] Pager: [phone]

## 2020-03-16 NOTE — Progress Notes (Signed)
[facility]  - [place] at [facility]   PATIENT NAME: Daniel Sanders    MR#:  [id number]  DATE OF BIRTH:  10/12/1945  SUBJECTIVE:  patient had an episode of twitching and becoming global lethargic although did answer and follow all commands during an episode. He said this is been happening for many years and a lot of workup has been done but so far no etiology has been so established. Mrs. Jesusita Oka present in the room no loss of bowel or urinary incontinence. Patient had long discussion with palliative care this morning denies any pain no focal weakness  denies any history of seizures or shaking spell at home per wife  REVIEW OF SYSTEMS:   Review of Systems  Constitutional: Negative for chills, fever and weight loss.  HENT: Negative for ear discharge, ear pain and nosebleeds.   Eyes: Negative for blurred vision, pain and discharge.  Respiratory: Negative for sputum production, shortness of breath, wheezing and stridor.   Cardiovascular: Negative for chest pain, palpitations, orthopnea and PND.  Gastrointestinal: Negative for abdominal pain, diarrhea, nausea and vomiting.  Genitourinary: Negative for frequency and urgency.  Musculoskeletal: Negative for back pain and joint pain.  Neurological: Positive for weakness. Negative for sensory change, speech change and focal weakness.  Psychiatric/Behavioral: Negative for depression and hallucinations. The patient is not nervous/anxious.    Tolerating Diet: Tolerating PT:   DRUG ALLERGIES:  No Known Allergies  VITALS:  Blood pressure (!) 158/97, pulse 91, temperature 97.8 F (36.6 C), temperature source Oral, resp. rate 17, height 6\' 2"  (1.88 m), weight (!) 144 kg, SpO2 100 %.  PHYSICAL EXAMINATION:   Physical Exam  GENERAL:  74 y.o.-year-old patient lying in the bed with no acute distress.  EYES: Pupils equal, round, reactive to light and accommodation. No scleral icterus.   HEENT: Head atraumatic,  normocephalic. Oropharynx and nasopharynx clear.  Right ear packing+ NECK:  Supple, no jugular venous distention. No thyroid enlargement, no tenderness.  LUNGS: Normal breath sounds bilaterally, no wheezing, rales, rhonchi. No use of accessory muscles of respiration.  CARDIOVASCULAR: S1, S2 normal. No murmurs, rubs, or gallops.  ABDOMEN: Soft, nontender, nondistended. Bowel sounds present. No organomegaly or mass.  EXTREMITIES: No cyanosis, clubbing or edema b/l.    NEUROLOGIC: Cranial nerves II through XII are intact. No focal Motor or sensory deficits b/l.   PSYCHIATRIC:  patient is alert and oriented x 3.  SKIN: No obvious rash, lesion, or ulcer.   LABORATORY PANEL:  CBC Recent Labs  Lab 03/15/20 0432  WBC 9.0  HGB 11.3*  HCT 36.1*  PLT 391    Chemistries  Recent Labs  Lab 03/15/20 0432 03/15/20 0432 03/15/20 0450 03/16/20 0501  NA 134*  --   --   --   K 4.0  --   --   --   CL 96*  --   --   --   CO2 26  --   --   --   GLUCOSE 265*  --   --   --   BUN 12  --   --   --   CREATININE 0.76   < >  --  0.73  CALCIUM 8.0*  --   --   --   MG  --   --  2.0  --   AST 17  --   --   --   ALT 19  --   --   --   ALKPHOS 89  --   --   --  BILITOT 0.6  --   --   --    < > = values in this interval not displayed.   Cardiac Enzymes No results for input(s): TROPONINI in the last 168 hours. RADIOLOGY:  [place] EKG SITE RITE  Result Date: 03/15/2020 If Site Rite image not attached, placement could not be confirmed due to current cardiac rhythm.  ASSESSMENT AND PLAN:  Carsten Carstarphen  is a 74 y.o. African-American male with a known history of type II obese mellitus, hypertension and dyslipidemia as well as history of right ear cholesteatoma and polyp status post excision in February after which the patient has had drainage from his ear which currently appears to be purulent and presents to the emergency room with acute onset of generalized weakness and lightheadedness and dizziness with  diminished p.o. 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 -MRI brain of the IAC showed "peripheral enhancement within the mastoid...may reflect abscess formation.  Thin right cerebral convexity subdural hematoma." -Case discussed with ENT, Dr. Jenne Campus.  This requires tertiary referral to ENT at academic center +/- neurosurgery.  No safe surgical approach to his disease is possible here.   Dr. Jenne Campus has discussed with patient and wife. Patient does not want any surgery. Tertiary care transfer has been discontinued. -Continue vancomycin, Zosyn -Follow ear culture--so far no growth -- February Ear culture grew Pseudomonas -ID consultation with Dr. Sampson Goon-- recommends PICC line placement.  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 patient getting IV antibiotics. PICC line needs to be placed.PT to see patient.        TOTAL TIME TAKING CARE OF THIS PATIENT: *25 minutes.  >50% time spent on counselling and coordination of care  Note: This dictation was prepared with Dragon dictation along with smaller phrase technology. Any transcriptional errors that result from this process are unintentional.  Enedina Finner M.D    [facility]   CC: Primary care physician; System, Provider Not InPatient ID: Adit Riddles, male   DOB: [birth year], 74 y.o.   MRN: [id number]

## 2020-03-16 NOTE — Progress Notes (Signed)
[facility] Room 158 [facility] Hospital Liaison RN note:  Received referral from Harvest Dark, NP and Deliliah Earl Lites [facility] for [facility]patient Palliative Program to follow post discharge.  Patient information has been given to referral and we will follow for disposition.  Thank you for this referral.  Cyndra Numbers, RN [facility] Liaison [phone]

## 2020-03-17 DIAGNOSIS — R42 Dizziness and giddiness: Secondary | ICD-10-CM

## 2020-03-17 DIAGNOSIS — R404 Transient alteration of awareness: Secondary | ICD-10-CM

## 2020-03-17 LAB — GLUCOSE, CAPILLARY
Glucose-Capillary: 327 mg/dL — ABNORMAL HIGH (ref 70–99)
Glucose-Capillary: 264 mg/dL — ABNORMAL HIGH (ref 70–99)
Glucose-Capillary: 275 mg/dL — ABNORMAL HIGH (ref 70–99)
Glucose-Capillary: 163 mg/dL — ABNORMAL HIGH (ref 70–99)
Glucose-Capillary: 230 mg/dL — ABNORMAL HIGH (ref 70–99)

## 2020-03-17 MED ORDER — INSULIN ASPART 100 UNIT/ML ~~LOC~~ SOLN
12.0000 [IU] | Freq: Three times a day (TID) | SUBCUTANEOUS | Status: DC
  Administered 2020-03-17 – 2020-03-18 (×4): 12 [IU] via SUBCUTANEOUS
  Filled 2020-03-17 (×4): qty 1

## 2020-03-17 MED ORDER — INSULIN GLARGINE 100 UNIT/ML ~~LOC~~ SOLN
38.0000 [IU] | Freq: Two times a day (BID) | SUBCUTANEOUS | Status: DC
  Administered 2020-03-17 – 2020-03-18 (×3): 38 [IU] via SUBCUTANEOUS
  Filled 2020-03-17 (×6): qty 0.38

## 2020-03-17 NOTE — Progress Notes (Signed)
[place] at [place] NAME: Daniel Sanders    MR#:  [id number]  DATE OF BIRTH:  [birth year]  SUBJECTIVE:  patient had an episode of inability to speak although did answer and follow all commands during an episode. He said this is been happening for many years and extensive workup has been done but so far no etiology has been so established. Mrs. Migdalia Dk present in the room no loss of bowel or urinary incontinence. denies any pain no focal weakness  denies any history of seizures or shaking spell at home per wife  REVIEW OF SYSTEMS:   Review of Systems  Constitutional: Negative for chills, fever and weight loss.  HENT: Negative for ear discharge, ear pain and nosebleeds.   Eyes: Negative for blurred vision, pain and discharge.  Respiratory: Negative for sputum production, shortness of breath, wheezing and stridor.   Cardiovascular: Negative for chest pain, palpitations, orthopnea and PND.  Gastrointestinal: Negative for abdominal pain, diarrhea, nausea and vomiting.  Genitourinary: Negative for frequency and urgency.  Musculoskeletal: Negative for back pain and joint pain.  Neurological: Positive for weakness. Negative for sensory change, speech change and focal weakness.  Psychiatric/Behavioral: Negative for depression and hallucinations. The patient is not nervous/anxious.    Tolerating Diet:yes Tolerating PT: pending  DRUG ALLERGIES:  No Known Allergies  VITALS:  Blood pressure 127/81, pulse 67, temperature 97.8 F (36.6 C), temperature source Oral, resp. rate 18, height _0  (1.88 m), weight (!) 144 kg, SpO2 97 %.  PHYSICAL EXAMINATION:   Physical Exam  GENERAL:  74 y.o.-year-old patient lying in the bed with no acute distress.  EYES: Pupils equal, round, reactive to light and accommodation. No scleral icterus.   HEENT: Head atraumatic, normocephalic. Oropharynx and nasopharynx clear.  Right ear packing+ NECK:  Supple, no  jugular venous distention. No thyroid enlargement, no tenderness.  LUNGS: Normal breath sounds bilaterally, no wheezing, rales, rhonchi. No use of accessory muscles of respiration.  CARDIOVASCULAR: S1, S2 normal. No murmurs, rubs, or gallops.  ABDOMEN: Soft, nontender, nondistended. Bowel sounds present. No organomegaly or mass.  EXTREMITIES: No cyanosis, clubbing or edema b/l.    NEUROLOGIC: Cranial nerves II through XII are intact. No focal Motor or sensory deficits b/l.   PSYCHIATRIC:  patient is alert and oriented x 3.  SKIN: No obvious rash, lesion, or ulcer.   LABORATORY PANEL:  CBC Recent Labs  Lab 03/15/20 0432  WBC 9.0  HGB 11.3*  HCT 36.1*  PLT 391    Chemistries  Recent Labs  Lab 03/15/20 0432 03/15/20 0432 03/15/20 0450 03/16/20 0501  NA 134*  --   --   --   K 4.0  --   --   --   CL 96*  --   --   --   CO2 26  --   --   --   GLUCOSE 265*  --   --   --   BUN 12  --   --   --   CREATININE 0.76   < >  --  0.73  CALCIUM 8.0*  --   --   --   MG  --   --  2.0  --   AST 17  --   --   --   ALT 19  --   --   --   ALKPHOS 89  --   --   --   BILITOT 0.6  --   --   --    < > =  values in this interval not displayed.   Cardiac Enzymes No results for input(s): TROPONINI in the last 168 hours. RADIOLOGY:  [place] EKG SITE RITE  Result Date: 03/15/2020 If Site Rite image not attached, placement could not be confirmed due to current cardiac rhythm.  ASSESSMENT AND PLAN:  Arinze Rivadeneira  is a 74 y.o. 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 -MRI brain of the IAC showed "peripheral enhancement within the mastoid...may reflect abscess formation.  Thin right cerebral convexity subdural hematoma." -Case discussed with ENT, Dr. Tami Ribas.  This requires tertiary referral to ENT at academic center +/- neurosurgery.   -  Dr. Tami Ribas has discussed with patient and wife. Patient does not want any surgery. Tertiary care transfer has been discontinued. -Follow ear culture--so far no growth --February 2021  Ear culture grew Pseudomonas -ID consultation with Dr. 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 patient getting IV antibiotics. PT to see patient. EEG ordered for altered level of consciousness/spells       TOTAL TIME TAKING CARE OF THIS PATIENT: *25 minutes.  >50% time spent on  counselling and coordination of care  Note: This dictation was prepared with Dragon dictation along with smaller phrase technology. Any transcriptional errors that result from this process are unintentional.  Fritzi Mandes M.D    [facility]   CC: Primary care physician; System, Provider Not InPatient ID: Philo Kurtz, male   DOB: 1946-02-02, 74 y.o.   MRN: [id number]

## 2020-03-17 NOTE — Evaluation (Signed)
Physical Therapy Evaluation Patient Details Name: Daniel Sanders MRN: [id number] DOB: [birth year] Today's Date: 03/17/2020   History of Present Illness  Daniel Sanders  is a 74 y.o. 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 intake for the last week. 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 He is refusing surgery and therefore they are treating with IV antibiotics.  Clinical Impression  74 yo Male reports feeling better following IV antibiotics. He denies any dizziness or fatigue at time of evaluation. Patient does express feeling weakness in BLE and is concerned about negotiating steps to enter house. He is currently modified independent for bed mobility, supervision for sit<>Stand transfers with RW. He does require CGA for gait in room x15 feet with RW with reciprocal gait pattern. He exhibits slight unsteadiness but was able to maintain balance. Patient does exhibit good strength in BLE (grossly 4/5). Concerned he is experiencing low endurance which is contributing to weakness feeling. Instructed patient in seated/supine LE strengthening exercise with instruction to increase hold time/slow down LE movement for better strengthening. Patient does have 4-5 steps to enter house. He is concerned about being able to negotiate steps to safely enter/exit home as he does not have any help at home except for his wife. He reports his wife is unable to provide him any physical assistance due to his larger frame and her weakness. He would benefit from skilled PT intervention to improve  strength and mobility;     Follow Up Recommendations Home health PT    Equipment Recommendations       Recommendations for Other Services       Precautions / Restrictions Precautions Precautions: Fall Restrictions Weight Bearing Restrictions: No      Mobility  Bed Mobility Overal bed mobility: Modified Independent             General bed mobility comments: Rails + HoB elevated  Transfers Overall transfer level: Needs assistance Equipment used: Rolling walker (2 wheeled) Transfers: Sit to/from Stand Sit to Stand: Supervision         General transfer comment: patient able to transfer sit<>Stand from bed with supervision with good hand placement and safety awareness;  Ambulation/Gait Ambulation/Gait assistance: Min guard Gait Distance (Feet): 15 Feet Assistive device: Rolling walker (2 wheeled) Gait Pattern/deviations: Step-through pattern Gait velocity: decreased   General Gait Details: pt ambulates in room with RW, CGA for safety with step through gait pattern, slightly slower gait speed, good foot clearance;  Stairs            Wheelchair Mobility    Modified Rankin (Stroke Patients Only)       Balance   Sitting-balance support: No upper extremity supported;Feet supported Sitting balance-Leahy Scale: Good     Standing balance support: Bilateral upper extremity supported;During functional activity Standing balance-Leahy Scale: Fair (requires RW and CGA for safety;)                               Pertinent Vitals/Pain Pain Assessment: No/denies pain    Home Living Family/patient expects to be discharged to:: Private  residence Living Arrangements: Spouse/significant other Available Help at Discharge: Family;Available 24 hours/day Type of Home: House Home Access: Stairs to enter Entrance Stairs-Rails: Can reach both Entrance Stairs-Number of Steps: 4 Home Layout: Two level;Able to live on main level with bedroom/bathroom;Other  (Comment) (has basement) Home Equipment: Walker - 2 wheels;Cane - single point      Prior Function Level of Independence: Independent with assistive device(s)         Comments: Pt reports hx of falls, uses SPC for mobility.      Hand Dominance        Extremity/Trunk Assessment   Upper Extremity Assessment Upper Extremity Assessment: Overall WFL for tasks assessed    Lower Extremity Assessment Lower Extremity Assessment: Overall WFL for tasks assessed;RLE deficits/detail;LLE deficits/detail RLE Deficits / Details: exhibits 4/5 strength in BLE; weakness likely related to poor endurance; LLE Deficits / Details: exhibits 4/5 strength in BLE; weakness likely related to poor endurance;    Cervical / Trunk Assessment Cervical / Trunk Assessment: Normal  Communication   Communication: No difficulties  Cognition Arousal/Alertness: Awake/alert Behavior During Therapy: WFL for tasks assessed/performed Overall Cognitive Status: Within Functional Limits for tasks assessed                                        General Comments General comments (skin integrity, edema, etc.): no bruising; no swelling noted;    Exercises General Exercises - Lower Extremity Long Arc Quad: AROM;Both;10 reps;Seated Straight Leg Raises: AROM;Both;5 reps;Seated Other Exercises Other Exercises: Patient expressed desire to do resistance exercise. He requested hand weights and resistance bands. PT expressed to patient how we typically don't offer that in the acute setting mainly for safety as he should be supervised during strengthening exercise. Other Exercises: Educated patient in seated/supine exercise with instruction to slow down LE movement and increase hold time for increased strengthening. patient also educated in bridges for posterior leg strengthening. he verbalized understanding. However when patient realized he was not getting resistance bands he appeared disinterested in LE  movement and was more focused on eating his lunch. He did verbalize understanding of HEP, however would benefit from further reinstruction; (x8 min)   Assessment/Plan    PT Assessment Patient needs continued PT services  PT Problem List Decreased strength;Decreased mobility;Decreased safety awareness;Decreased activity tolerance;Decreased balance       PT Treatment Interventions DME instruction;Gait training;Stair training;Functional mobility training;Therapeutic activities;Therapeutic exercise;Balance training;Neuromuscular re-education;Patient/family education    PT Goals (Current goals can be found in the Care Plan section)  Acute Rehab PT Goals Patient Stated Goal: to return home PT Goal Formulation: With patient Time For Goal Achievement: 03/31/20 Potential to Achieve Goals: Good    Frequency Min 2X/week   Barriers to discharge Inaccessible home environment has steps to enter with B rails;    Co-evaluation               AM-PAC PT "6 Clicks" Mobility  Outcome Measure Help needed turning from your back to your side while in a flat bed without using bedrails?: None Help needed moving from lying on your back to sitting on the side of a flat bed without using bedrails?: None Help needed moving to and from a bed to a chair (including a wheelchair)?: A Little Help needed standing up from a chair using your arms (e.g., wheelchair or bedside chair)?: None Help needed to walk in hospital room?: A Little  Help needed climbing 3-5 steps with a railing? : A Lot 6 Click Score: 20    End of Session Equipment Utilized During Treatment: Gait belt Activity Tolerance: Patient tolerated treatment well;No increased pain Patient left: in chair;with call bell/phone within reach;with chair alarm set Nurse Communication: Mobility status PT Visit Diagnosis: Unsteadiness on feet (R26.81);Other abnormalities of gait and mobility (R26.89);Muscle weakness (generalized) (M62.81);History of  falling (Z91.81)    Time: 1121-1200 PT Time Calculation (min) (ACUTE ONLY): 39 min   Charges:   PT Evaluation $PT Eval Low Complexity: 1 Low PT Treatments $Therapeutic Exercise: 8-22 mins         Analyssa Downs PT, DPT 03/17/2020, 1:28 PM

## 2020-03-17 NOTE — Progress Notes (Signed)
eeg done °

## 2020-03-17 NOTE — Procedures (Signed)
Patient Name: Daniel Sanders  MRN: [id number]  Epilepsy Attending: Charlsie Quest  Referring Physician/Provider: Dr Enedina Finner Date: 03/17/2020 Duration: 27.36mins  Patient history: 13o M with episodes of altered level of consciousness. EEG to evaluate for seizure.  Level of alertness: Awake, asleep  AEDs during EEG study: None  Technical aspects: This EEG study was done with scalp electrodes positioned according to the 10-20 International system of electrode placement. Electrical activity was acquired at a sampling rate of 500Hz  and reviewed with a high frequency filter of 70Hz  and a low frequency filter of 1Hz . EEG data were recorded continuously and digitally stored.   Description: The posterior dominant rhythm consists of 8 Hz activity of moderate voltage (25-35 uV) seen predominantly in posterior head regions, symmetric and reactive to eye opening and eye closing.  Sleep was characterized by vertex waves, sleep spindles (12 to 14 Hz), maximal frontocentral region. Physiology photic driving was not seen during photic stimulation.  Hyperventilation was not performed.     IMPRESSION: This study is within normal limits. No seizures or epileptiform discharges were seen throughout the recording.  Daniel Sanders 

## 2020-03-18 LAB — GLUCOSE, CAPILLARY
Glucose-Capillary: 113 mg/dL — ABNORMAL HIGH (ref 70–99)
Glucose-Capillary: 280 mg/dL — ABNORMAL HIGH (ref 70–99)
Glucose-Capillary: 229 mg/dL — ABNORMAL HIGH (ref 70–99)
Glucose-Capillary: 257 mg/dL — ABNORMAL HIGH (ref 70–99)

## 2020-03-18 LAB — CULTURE, BLOOD (ROUTINE X 2)
Culture: NO GROWTH
Culture: NO GROWTH

## 2020-03-18 NOTE — Progress Notes (Addendum)
Daniel Sanders at [facility] NAME: Daniel Sanders    MR#:  [id number]  DATE OF BIRTH:  1945/12/22  SUBJECTIVE:  much awake alert having a better day today. Work with physical therapy well yesterday. REVIEW OF SYSTEMS:   Review of Systems  Constitutional: Negative for chills, fever and weight loss.  HENT: Negative for ear discharge, ear pain and nosebleeds.   Eyes: Negative for blurred vision, pain and discharge.  Respiratory: Negative for sputum production, shortness of breath, wheezing and stridor.   Cardiovascular: Negative for chest pain, palpitations, orthopnea and PND.  Gastrointestinal: Negative for abdominal pain, diarrhea, nausea and vomiting.  Genitourinary: Negative for frequency and urgency.  Musculoskeletal: Negative for back pain and joint pain.  Neurological: Positive for weakness. Negative for sensory change, speech change and focal weakness.  Psychiatric/Behavioral: Negative for depression and hallucinations. The patient is not nervous/anxious.    Tolerating Diet:yes Tolerating XV:QMGQ/QP  DRUG ALLERGIES:  No Known Allergies  VITALS:  Blood pressure (!) 137/81, pulse 67, temperature 97.9 F (36.6 C), temperature source Oral, resp. rate 17, height '6\' 2"'$  (1.88 m), weight (!) 144 kg, SpO2 100 %.  PHYSICAL EXAMINATION:   Physical Exam  GENERAL:  74 y.o.-year-old patient lying in the bed with no acute distress.  EYES: Pupils equal, round, reactive to light and accommodation. No scleral icterus.   HEENT: Head atraumatic, normocephalic. Oropharynx and nasopharynx clear.  Right ear packing+ NECK:  Supple, no jugular venous distention. No thyroid enlargement, no tenderness.  LUNGS: Normal breath sounds bilaterally, no wheezing, rales, rhonchi. No use of accessory muscles of respiration.  CARDIOVASCULAR: S1, S2 normal. No murmurs, rubs, or gallops.  ABDOMEN: Soft, nontender, nondistended. Bowel sounds present. No organomegaly or  mass.  EXTREMITIES: No cyanosis, clubbing or edema b/l.    NEUROLOGIC: Cranial nerves II through XII are intact. No focal Motor or sensory deficits b/l.   PSYCHIATRIC:  patient is alert and oriented x 3.  SKIN: No obvious rash, lesion, or ulcer.   LABORATORY PANEL:  CBC Recent Labs  Lab 03/15/20 0432  WBC 9.0  HGB 11.3*  HCT 36.1*  PLT 391    Chemistries  Recent Labs  Lab 03/15/20 0432 03/15/20 0432 03/15/20 0450 03/16/20 0501  NA 134*  --   --   --   K 4.0  --   --   --   CL 96*  --   --   --   CO2 26  --   --   --   GLUCOSE 265*  --   --   --   BUN 12  --   --   --   CREATININE 0.76   < >  --  0.73  CALCIUM 8.0*  --   --   --   MG  --   --  2.0  --   AST 17  --   --   --   ALT 19  --   --   --   ALKPHOS 89  --   --   --   BILITOT 0.6  --   --   --    < > = values in this interval not displayed.   Cardiac Enzymes No results for input(s): TROPONINI in the last 168 hours. RADIOLOGY:  EEG  Result Date: 03/17/2020 Lora Havens, MD     03/17/2020  5:25 PM Patient Name: Daniel Sanders MRN: [id number] Epilepsy Attending: Lora Havens Referring Physician/Provider:  Dr Fritzi Mandes Date: 03/17/2020 Duration: 27.9mns Patient history: 89oM with episodes of altered level of consciousness. EEG to evaluate for seizure. Level of alertness: Awake, asleep AEDs during EEG study: None Technical aspects: This EEG study was done with scalp electrodes positioned according to the 10-20 International system of electrode placement. Electrical activity was acquired at a sampling rate of _0  and reviewed with a high frequency filter of _1  and a low frequency filter of _2 . EEG data were recorded continuously and digitally stored. Description: The posterior dominant rhythm consists of 8 Hz activity of moderate voltage (25-35 uV) seen predominantly in posterior head regions, symmetric and reactive to eye opening and eye closing.  Sleep was characterized by vertex waves, sleep spindles (12  to 14 Hz), maximal frontocentral region. Physiology photic driving was not seen during photic stimulation.  Hyperventilation was not performed.   IMPRESSION: This study is within normal limits. No seizures or epileptiform discharges were seen throughout the recording. PMalcom  ASSESSMENT AND PLAN:  Daniel Sanders is a 74y.o. 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 -MRI brain of the IAC showed "peripheral enhancement within the mastoid...may reflect abscess formation.  Thin right cerebral convexity subdural hematoma." -  Dr. MTami Ribashas discussed with patient and wife. Patient does not want any surgery. Tertiary care transfer has been discontinued. -Follow ear culture--+rare pseudomonas --February 2021  Ear culture grew Pseudomonas -ID consultation with Dr. 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 Pt wants to stay one more day -will d/c home tomorrow with PT/OT and home IV abxs      TOTAL TIME TAKING CARE OF THIS PATIENT: *15 minutes.  >50% time spent on counselling and coordination of care  Note: This dictation was prepared with Dragon dictation along with smaller phrase technology. Any transcriptional errors that result from this process  are unintentional.  Fritzi Mandes M.D    [facility]   CC: Primary care physician; System, Provider Not InPatient ID: Helios Kohlmann, male   DOB: 02/05/1946, 74 y.o.   MRN: [id number]

## 2020-03-18 NOTE — Progress Notes (Signed)
03/18/2020 11:28 AM  Daniel Sanders [id number]  Hospital day 5.  Patient feeling good, no headaches no other neurologic changes.  EEG result no evidence of seizure activity.   Temp:  [97.6 F (36.4 C)-98.5 F (36.9 C)] 97.9 F (36.6 C) (08/01 0805) Pulse Rate:  [67-70] 67 (08/01 0805) Resp:  [17-18] 17 (08/01 0805) BP: (119-138)/(79-81) 137/81 (08/01 0805) SpO2:  [95 %-100 %] 100 % (08/01 0805),     Intake/Output Summary (Last 24 hours) at 03/18/2020 1128 Last data filed at 03/17/2020 2119 Gross per 24 hour  Intake 530 ml  Output 450 ml  Net 80 ml    Results for orders placed or performed during the hospital encounter of 03/13/20 (from the past 24 hour(s))  Glucose, capillary     Status: Abnormal   Collection Time: 03/17/20 11:50 AM  Result Value Ref Range   Glucose-Capillary 275 (H) 70 - 99 mg/dL  Glucose, capillary     Status: Abnormal   Collection Time: 03/17/20  4:52 PM  Result Value Ref Range   Glucose-Capillary 163 (H) 70 - 99 mg/dL  Glucose, capillary     Status: Abnormal   Collection Time: 03/17/20  9:06 PM  Result Value Ref Range   Glucose-Capillary 230 (H) 70 - 99 mg/dL  Glucose, capillary     Status: Abnormal   Collection Time: 03/18/20  7:59 AM  Result Value Ref Range   Glucose-Capillary 113 (H) 70 - 99 mg/dL    SUBJECTIVE: Patient feeling good appetite good.  Patient remains afebrile with no complaints.  He has been up and out of bed working with physical therapy.  OBJECTIVE: Exam of the ear shows continued to have purulent discharge approximately the same as yesterday.  No significant cervical swelling noted  IMPRESSION: Severe otitis externa most likely osteomyelitis of the skull base.  Patient continues to not wish for any surgical intervention.  He is scheduled to discharged home with IV medication at home as per infectious disease.  Culture continues to grow out Pseudomonas which is sensitive to Zosyn.  EEG results not show any evidence of seizure  activity will defer to neurology for further work-up.  PLAN: Discharged home tomorrow on IV antibiotic therapy as per infectious disease.  I will follow-up with him in the clinic in 2 to 3 weeks.  Patient understands is well acquainted with my clinic will call should there be any questions.  We will sign off for now and will schedule follow-up for him in 2 weeks.    Davina Poke 03/18/2020, 11:28 AM

## 2020-03-19 DIAGNOSIS — Z7189 Other specified counseling: Secondary | ICD-10-CM

## 2020-03-19 LAB — GLUCOSE, CAPILLARY
Glucose-Capillary: 111 mg/dL — ABNORMAL HIGH (ref 70–99)
Glucose-Capillary: 138 mg/dL — ABNORMAL HIGH (ref 70–99)
Glucose-Capillary: 250 mg/dL — ABNORMAL HIGH (ref 70–99)

## 2020-03-19 LAB — AEROBIC/ANAEROBIC CULTURE W GRAM STAIN (SURGICAL/DEEP WOUND)

## 2020-03-19 MED ORDER — PIPERACILLIN-TAZOBACTAM IV (FOR PTA / DISCHARGE USE ONLY): 13.5000 g | INTRAVENOUS | 0 refills | Status: AC

## 2020-03-19 MED ORDER — NOVOLIN N 100 UNIT/ML ~~LOC~~ SUSP: 38.0000 [IU] | Freq: Two times a day (BID) | SUBCUTANEOUS | 11 refills | Status: AC

## 2020-03-19 MED ORDER — PREDNISONE 10 MG PO TABS: ORAL_TABLET | ORAL | 0 refills | Status: DC

## 2020-03-19 MED ORDER — NOVOLIN R 100 UNIT/ML IJ SOLN: 12.0000 [IU] | Freq: Three times a day (TID) | INTRAMUSCULAR | 11 refills | Status: AC

## 2020-03-19 MED ORDER — LISINOPRIL 40 MG PO TABS: 40.0000 mg | ORAL_TABLET | Freq: Every day | ORAL | 0 refills | Status: AC

## 2020-03-19 MED ORDER — TOBRAMYCIN-DEXAMETHASONE 0.3-0.1 % OP SUSP: 4.0000 [drp] | Freq: Two times a day (BID) | OPHTHALMIC | 0 refills | Status: AC

## 2020-03-19 NOTE — Progress Notes (Signed)
PHARMACY CONSULT NOTE FOR:  OUTPATIENT  PARENTERAL ANTIBIOTIC THERAPY (OPAT)  Indication: pseudomonas otitis externa and skull osteomyelitis Regimen: piperactillin/tazobactam 13.5gm IV as continuous infusion over 24h End date: 04/27/2020  IV antibiotic discharge orders are pended. To discharging provider:  please sign these orders via discharge navigator,  Select New Orders & click on the button choice - Manage This Unsigned Work.    Updated day supply of antibiotic.    Thank you for allowing pharmacy to be a part of this patient's care.  Cephus Shelling, PharmD Clinical Pharmacist   03/19/2020 9:22 AM

## 2020-03-19 NOTE — Discharge Summary (Signed)
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 Arthritis   . Diabetes mellitus without complication ([place])   . Hyperlipidemia associated with type 2 diabetes mellitus ([place])   . Hypertension   . Obesity   . Sleep apnea    per patient, incorrect  . 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 intake for the last week  Osteomyelitis of the skull base due to adjacent necrotizing otitis externa -MRI brain of the IAC showed "peripheral enhancement within the mastoid...may reflect abscess formation. Thin right cerebral convexity subdural hematoma." - Dr. Tami Sanders has discussed with patient and wife. Patient does not want any surgery. Tertiary care transfer has been discontinued. -Follow ear culture--+rare pseudomonas --February 2021  Ear culture grew Pseudomonas -ID consultation with Dr. 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  Anemia Mild, present on admission  Procedures: Family communication : discussed with patient and wife Consults : ENT, ID CODE STATUS: full DVT Prophylaxis : Lovenox  Status is: Inpatient  Dispo: The patient is from: Home  Anticipated d/c is to: Home with HH and IV abxs  Anticipated d/c date is: today  Patient currently is medically [place] -will d/c home today with PT/OT and home IV abxs  CONSULTS OBTAINED:  Treatment Team:  Daniel Ramsay, MD  DRUG ALLERGIES:  No Known Allergies  DISCHARGE MEDICATIONS:   Allergies as of 03/19/2020   No Known Allergies     Medication List    TAKE these medications   amLODipine 5 MG tablet Commonly known as: NORVASC Take 5 mg by mouth daily.   aspirin EC 81 MG tablet Take 81 mg by mouth daily.   atorvastatin 20 MG tablet Commonly known as: LIPITOR Take 20 mg by mouth at bedtime.   furosemide 40 MG tablet Commonly known as: LASIX Take 40 mg by mouth every evening.   lisinopril 40 MG tablet Commonly known as: ZESTRIL Take 1 tablet (40 mg total) by mouth daily. What changed: when to take this   metFORMIN 1000 MG tablet Commonly known as: GLUCOPHAGE Take 1,000 mg by mouth 2 (two) times daily.   NovoLIN N 100 UNIT/ML injection Generic drug: insulin NPH Human Inject 0.38  mLs (38 Units total) into the skin 2 (two) times daily before a meal. Inject 70 units subcutaneously in the morning & inject 60 units subcutaneously in the evening What changed:   how much to  take  when to take this   NovoLIN R 100 units/mL injection Generic drug: insulin regular Inject 0.12 mLs (12 Units total) into the skin 3 (three) times daily before meals. Inject 25 units subcutaneously in the morning & inject 20 units subcutaneously in the evening What changed:   how much to take  when to take this   piperacillin-tazobactam  IVPB Commonly known as: ZOSYN Inject 13.5 g into the vein continuous. Infuse 13.5gm as continuous infusion over 24h Indication: pseudomonas aeruginosa otitis externa with skull osteomyeltitis First Dose: Yes Last Day of Therapy: 04/27/2020 Labs - Once weekly:  CBC/D, CMP, CRP Method of administration: Elastomeric (Continuous infusion) Method of administration may be changed at the discretion of home infusion pharmacist based upon assessment of the patient and/or caregiver's ability to self-administer the medication ordered.   predniSONE 10 MG tablet Commonly known as: DELTASONE Take 50 mg daily for two days then take 40 mg daily for two days then take 30 mg daily for two days then take 20 mg daily for two days then take 10 mg daily for two days--- and stop Start taking on: March 20, 2020   tobramycin-dexamethasone ophthalmic solution Commonly known as: TOBRADEX Place 4 drops into the right ear 2 (two) times daily.   vitamin B-12 1000 MCG tablet Commonly known as: CYANOCOBALAMIN Take 1,000 mcg by mouth daily.            Durable Medical Equipment  (From admission, onward)         Start     Ordered   03/16/20 1456  For home use only DME 4 wheeled rolling walker with seat  Once       Question:  Patient needs a walker to treat with the following condition  Answer:  Weakness   03/16/20 1455           Discharge Care Instructions  (From admission, onward)         Start     Ordered   03/19/20 0000  Change dressing on IV access line weekly and PRN  (Home infusion instructions - [facility] )        03/19/20 1015    03/19/20 0000  Discharge wound care:       Comments: Wound care to right ear. Cleanse ER with normal saline, pat dry. Apply dry dressing twice daily and PRN for drainage. Secure with paper tape.   03/19/20 1015          If you experience worsening of your admission symptoms, develop shortness of breath, life threatening emergency, suicidal or homicidal thoughts you must seek medical attention immediately by calling 911 or calling your MD immediately  if symptoms less severe.  You Must read complete instructions/literature along with all the possible adverse reactions/side effects for all the Medicines you take and that have been prescribed to you. Take any new Medicines after you have completely understood and accept all the possible adverse reactions/side effects.   Please note  You were cared for by a hospitalist during your hospital stay. If you have any questions about your discharge medications or the care you received while you were in the hospital after you are discharged, you can call the unit and asked to speak with the hospitalist on call  if the hospitalist that took care of you is not available. Once you are discharged, your primary care physician will handle any further medical issues. Please note that NO REFILLS for any discharge medications will be authorized once you are discharged, as it is imperative that you return to your primary care physician (or establish a relationship with a primary care physician if you do not have one) for your aftercare needs so that they can reassess your need for medications and monitor your lab values. Today   SUBJECTIVE   No new complaints  VITAL SIGNS:  Blood pressure (!) 136/79, pulse 69, temperature 97.9 F (36.6 C), temperature source Oral, resp. rate 18, height 6' 2"  (1.88 m), weight (!) 144 kg, SpO2 99 %.  I/O:    Intake/Output Summary (Last 24 hours) at 03/19/2020 1016 Last data filed at 03/19/2020 0108 Gross per 24 hour  Intake 480  ml  Output 820 ml  Net -340 ml    PHYSICAL EXAMINATION:  GENERAL:  74 y.o.-year-old patient lying in the bed with no acute distress.  EYES: Pupils equal, round, reactive to light and accommodation. No scleral icterus.  HEENT: Head atraumatic, normocephalic. Oropharynx and nasopharynx clear.  NECK:  Supple, no jugular venous distention. No thyroid enlargement, no tenderness.  LUNGS: Normal breath sounds bilaterally, no wheezing, rales,rhonchi or crepitation. No use of accessory muscles of respiration.  CARDIOVASCULAR: S1, S2 normal. No murmurs, rubs, or gallops.  ABDOMEN: Soft, non-tender, non-distended. Bowel sounds present. No organomegaly or mass.  EXTREMITIES: No pedal edema, cyanosis, or clubbing.  NEUROLOGIC: Cranial nerves II through XII are intact. Muscle strength 5/5 in all extremities. Sensation intact. Gait not checked.  PSYCHIATRIC: The patient is alert and oriented x 3.  SKIN: No obvious rash, lesion, or ulcer.   DATA REVIEW:   CBC  Recent Labs  Lab 03/15/20 0432  WBC 9.0  HGB 11.3*  HCT 36.1*  PLT 391    Chemistries  Recent Labs  Lab 03/15/20 0432 03/15/20 0432 03/15/20 0450 03/16/20 0501  NA 134*  --   --   --   K 4.0  --   --   --   CL 96*  --   --   --   CO2 26  --   --   --   GLUCOSE 265*  --   --   --   BUN 12  --   --   --   CREATININE 0.76   < >  --  0.73  CALCIUM 8.0*  --   --   --   MG  --   --  2.0  --   AST 17  --   --   --   ALT 19  --   --   --   ALKPHOS 89  --   --   --   BILITOT 0.6  --   --   --    < > = values in this interval not displayed.    Microbiology Results   Recent Results (from the past 240 hour(s))  Blood culture (routine x 2)     Status: None   Collection Time: 03/13/20  7:37 PM   Specimen: BLOOD  Result Value Ref Range Status   Specimen Description BLOOD BLOOD RIGHT WRIST  Final   Special Requests   Final    BOTTLES DRAWN AEROBIC AND ANAEROBIC Blood Culture results may not be optimal due to an inadequate volume  of blood received in culture  bottles   Culture   Final    NO GROWTH 5 DAYS Performed at [facility], [address] [place], [place] [address]    Report Status 03/18/2020 FINAL  Final  Blood culture (routine x 2)     Status: None   Collection Time: 03/13/20  7:42 PM   Specimen: BLOOD  Result Value Ref Range Status   Specimen Description BLOOD LEFT ANTECUBITAL  Final   Special Requests   Final    BOTTLES DRAWN AEROBIC AND ANAEROBIC Blood Culture results may not be optimal due to an inadequate volume of blood received in culture bottles   Culture   Final    NO GROWTH 5 DAYS Performed at [facility], [address] [place], [place] [address]    Report Status 03/18/2020 FINAL  Final  SARS Coronavirus 2 by RT PCR (hospital order, performed in [facility] hospital lab) Nasopharyngeal Nasopharyngeal Swab     Status: None   Collection Time: 03/13/20 11:05 PM   Specimen: Nasopharyngeal Swab  Result Value Ref Range Status   SARS Coronavirus 2 NEGATIVE NEGATIVE Final    Comment: (NOTE) SARS-CoV-2 target nucleic acids are NOT DETECTED.  The SARS-CoV-2 RNA is generally detectable in upper and lower respiratory specimens during the acute phase of infection. The lowest concentration of SARS-CoV-2 viral copies this assay can detect is 250 copies / mL. A negative result does not preclude SARS-CoV-2 infection and should not be used as the sole basis for treatment or other patient management decisions.  A negative result may occur with improper specimen collection / handling, submission of specimen other than nasopharyngeal swab, presence of viral mutation(s) within the areas targeted by this assay, and inadequate number of viral copies (<250 copies / mL). A negative result must be combined with clinical observations, patient history, and epidemiological information.  Fact Sheet for Patients:   StrictlyIdeas.no  Fact Sheet for Healthcare  Providers: BankingDealers.co.za  This test is not yet approved or  cleared by the [place] FDA and has been authorized for detection and/or diagnosis of SARS-CoV-2 by FDA under an Emergency Use Authorization (EUA).  This EUA will remain in effect (meaning this test can be used) for the duration of the COVID-19 declaration under Section 564(b)(1) of the Act, 21 U.S.C. section 360bbb-3(b)(1), unless the authorization is terminated or revoked sooner.  Performed at [facility], [address] [place], [place] [address]   Aerobic/Anaerobic Culture (surgical/deep wound)     Status: None (Preliminary result)   Collection Time: 03/14/20  8:17 AM   Specimen: Ear  Result Value Ref Range Status   Specimen Description   Final    EAR RIGHT Performed at [facility], [address] [place], [place] [address]    Special Requests   Final    NONE Performed at [facility], [address] [place], [place] [address]    Gram Stain   Final    RARE WBC PRESENT,BOTH PMN AND MONONUCLEAR NO ORGANISMS SEEN Performed at [facility], [place] [address] [place], [place] [address]    Culture   Final    RARE PSEUDOMONAS AERUGINOSA NO ANAEROBES ISOLATED; CULTURE IN PROGRESS FOR 5 DAYS    Report Status PENDING  Incomplete   Organism ID, Bacteria PSEUDOMONAS AERUGINOSA  Final      Susceptibility   Pseudomonas aeruginosa - MIC*    CEFTAZIDIME 2 SENSITIVE Sensitive     CIPROFLOXACIN <=0.25 SENSITIVE Sensitive     GENTAMICIN <=  1 SENSITIVE Sensitive     IMIPENEM 1 SENSITIVE Sensitive     PIP/TAZO <=4 SENSITIVE Sensitive     CEFEPIME 2 SENSITIVE Sensitive     * RARE PSEUDOMONAS AERUGINOSA    RADIOLOGY:  EEG  Result Date: 03/17/2020 Lora Havens, MD     03/17/2020  5:25 PM Patient Name: Thurmond Hildebran MRN: [id number] Epilepsy Attending: Lora Havens Referring Physician/Provider: Dr Fritzi Mandes Date: 03/17/2020 Duration: 27.26mns  Patient history: 89oM with episodes of altered level of consciousness. EEG to evaluate for seizure. Level of alertness: Awake, asleep AEDs during EEG study: None Technical aspects: This EEG study was done with scalp electrodes positioned according to the 10-20 International system of electrode placement. Electrical activity was acquired at a sampling rate of 500Hz  and reviewed with a high frequency filter of 70Hz  and a low frequency filter of 1Hz . EEG data were recorded continuously and digitally stored. Description: The posterior dominant rhythm consists of 8 Hz activity of moderate voltage (25-35 uV) seen predominantly in posterior head regions, symmetric and reactive to eye opening and eye closing.  Sleep was characterized by vertex waves, sleep spindles (12 to 14 Hz), maximal frontocentral region. Physiology photic driving was not seen during photic stimulation.  Hyperventilation was not performed.   IMPRESSION: This study is within normal limits. No seizures or epileptiform discharges were seen throughout the recording. Priyanka OBarbra Sarks    CODE STATUS:     Code Status Orders  (From admission, onward)         Start     Ordered   03/13/20 2335  Full code  Continuous        03/13/20 2341        Code Status History    This patient has a current code status but no historical code status.   [facility] Care Planning Activity       TOTAL TIME TAKING CARE OF THIS PATIENT: *35* minutes.    SFritzi MandesM.D  [facility]    CC: Primary care physician; System, Provider Not In

## 2020-03-19 NOTE — TOC Progression Note (Addendum)
Transition of Care [facility]) - Progression Note    Patient Details  Name: Daniel Sanders MRN: [id number] Date of Birth: 10/04/1945  [facility]) CM/SW Contact  Barrie Dunker, RN Phone Number: 03/19/2020, 10:27 AM  Clinical Narrative:   Patient to DC home, He is set up with [facility] for IV ABX, NO additonal needs, He lives at home with his wife, he is to get a RW and 3 in 1 from [facility] to take home,          Expected Discharge Plan and Services           Expected Discharge Date: 03/19/20                                     Social Determinants of Health (SDOH) Interventions    Readmission Risk Interventions No flowsheet data found.

## 2020-03-19 NOTE — Care Management Important Message (Signed)
Important Message  Patient Details  Name: Daniel Sanders MRN: [id number] Date of Birth: [birth year]   Medicare Important Message Given:  Yes     Zionah Criswell Clementeen Hoof, RN 03/19/2020, 10:23 AM

## 2020-03-19 NOTE — Progress Notes (Signed)
Discharge summary reviewed with verbal understanding. Rxs and equipment given at discharge

## 2020-03-19 NOTE — Progress Notes (Signed)
Inpatient Diabetes Program Recommendations  AACE/ADA: New Consensus Statement on Inpatient Glycemic Control   Target Ranges:  Prepandial:   less than 140 mg/dL      Peak postprandial:   less than 180 mg/dL (1-2 hours)      Critically ill patients:  140 - 180 mg/dL   Results for MURRIEL, EIDEM (MRN [id number]) as of 03/19/2020 09:52  Ref. Range 03/18/2020 07:59 03/18/2020 11:47 03/18/2020 17:12 03/18/2020 21:01 03/19/2020 07:54  Glucose-Capillary Latest Ref Range: 70 - 99 mg/dL 113 (H) 280 (H)  Novolog 16 units  Lantus 38 units 229 (H)  Novolog 19 units 257 (H)  Novolog 3 units  Lantus 38 units 111 (H)   Review of Glycemic Control  Diabetes history: DM2 Outpatient Diabetes medications: NPH 70 units QAM, NPH 60 units QPM, Regular 25 units QAM, Regular 20 units QPM Current orders for Inpatient glycemic control: Lantus 38 units BID, Novolog 0-20 units TID with meals, Novolog 0-5 units QHS, Novolog 12 units TID with meals for meal coverage; Prednisone 50 mg QAM (tapering)  Inpatient Diabetes Program Recommendations:    Meal coverage parameters: Noted meal coverage insulin NOT GIVEN with breakfast yesterday or today. Added administration instructions to meal coverage order (Do NOT give if CBG < 80 or if patient is on premixed insulin; Do NOT give if meal intake is less than 50 %). RNs, please administer meal coverage insulin when parameters met.   Insulin-Meal Coverage: If steroids are continued, please consider increasing meal coverage to Novolog 16 units TID with meals.  Thanks, Barnie Alderman, RN, MSN, CDE Diabetes Coordinator Inpatient Diabetes Program [phone] (Team Pager from 8am to 5pm)

## 2020-03-19 NOTE — Progress Notes (Signed)
Pharmacy Antibiotic Note  Daniel Sanders is a 74 y.o. male admitted on 03/13/2020 with necrotizing otitis externa. Pharmacy has been consulted for vancomycin and Zosyn dosing.  Pt received Vancomycin 2500 mg IV x 1 loading dose  Plan: Zosyn 3.375g IV q8h (4 hour infusion).  Height: 6\' 2"  (188 cm) Weight: (!) 144 kg (317 lb 7.4 oz) IBW/kg (Calculated) : 82.2  Temp (24hrs), Avg:97.7 F (36.5 C), Min:97.5 F (36.4 C), Max:97.9 F (36.6 C)  Recent Labs  Lab 03/13/20 1855 03/13/20 2108 03/14/20 0457 03/15/20 0432 03/16/20 0501  WBC 8.9  --  5.6 9.0  --   CREATININE 0.78  --  0.60* 0.76 0.73  LATICACIDVEN 2.7* 2.0*  --   --   --     Estimated Creatinine Clearance: 118.8 mL/min (by C-G formula based on SCr of 0.73 mg/dL).    No Known Allergies  Antimicrobials this admission: Ciprofloxacin 7/27 >> 7/28 Ceftriaxone 1g 7/27 >> 7/27 Unasyn 7/27 >> 7/28 Vancomycin 7/28 >> 7/30 Zosyn 7/28 >>  Dose adjustments this admission: N/A  Microbiology results: 7/27 BCx: NGTD 7/27 Body fluid cx from right ear: sent 7/28 aerobic/anaerobic cx from right ear: PsA; sensitive to Zosyn   Thank you for allowing pharmacy to be a part of this patient's care.  8/28, PharmD Clinical Pharmacist  03/19/2020 9:23 AM

## 2020-03-27 ENCOUNTER — Telehealth: Payer: Self-pay | Admitting: Adult Health Nurse Practitioner

## 2020-03-27 LAB — GLUCOSE, CAPILLARY: Glucose-Capillary: 261 mg/dL — ABNORMAL HIGH (ref 70–99)

## 2020-03-27 NOTE — Telephone Encounter (Signed)
Spoke with patient regarding the Palliative referral and explained Palliative services to him and patient has declined Palliative services at this time.  He stated that he felt like he needed the home health services that he is currently receiving over Palliative at this time.  He was in agreement with [place] cancelling this referral and he was instructed to contact MD office if he wished to pursue Palliative services in the future.  Will notify MD office.

## 2020-03-29 ENCOUNTER — Other Ambulatory Visit: Payer: Self-pay | Admitting: Unknown Physician Specialty

## 2020-03-29 DIAGNOSIS — H6021 Malignant otitis externa, right ear: Secondary | ICD-10-CM

## 2020-04-16 ENCOUNTER — Ambulatory Visit
Admission: RE | Admit: 2020-04-16 | Discharge: 2020-04-16 | Disposition: A | Discharge status: Home / Self Care | Payer: Medicare Other | Source: Ambulatory Visit | Attending: Unknown Physician Specialty | Admitting: Unknown Physician Specialty

## 2020-04-16 ENCOUNTER — Other Ambulatory Visit: Payer: Self-pay

## 2020-04-16 DIAGNOSIS — H6021 Malignant otitis externa, right ear: Secondary | ICD-10-CM | POA: Insufficient documentation

## 2020-04-16 MED ORDER — IOHEXOL 300 MG/ML  SOLN
75.0000 mL | Freq: Once | INTRAMUSCULAR | Status: AC | PRN
  Administered 2020-04-16: 75 mL via INTRAVENOUS

## 2020-04-20 DIAGNOSIS — E669 Obesity, unspecified: Secondary | ICD-10-CM | POA: Insufficient documentation

## 2020-05-11 ENCOUNTER — Telehealth: Payer: Self-pay | Admitting: Adult Health Nurse Practitioner

## 2020-05-11 NOTE — Telephone Encounter (Signed)
Spoke with wife, Kathie Rhodes, regarding the Palliative referral and after explaining what Palliative services were, she did not want to give me verbal consent to start services, she preferred that I speak with the patient.  She said that the Physical Therapist was there from [facility] working with him at the time of my call and she took my name and number and will have patient call me back.

## 2020-05-15 ENCOUNTER — Telehealth: Payer: Self-pay | Admitting: Adult Health Nurse Practitioner

## 2020-05-15 NOTE — Telephone Encounter (Signed)
Called patient's home number to schedule a Palliative Consult, no answer - left message with reason for call along with my name and contact number.  I then called patient's cell and spoke with him.  After discussing Palliative services with him and all questions were answered he was in agreement with setting upa time for the NP to come out and discuss this in more detail with him.  I gave him a date/time to schedule and he wanted to speak with his wife first to confirm this with her.  He took my number and said he would call me back.

## 2020-05-22 ENCOUNTER — Telehealth: Payer: Self-pay | Admitting: Adult Health Nurse Practitioner

## 2020-05-22 NOTE — Telephone Encounter (Signed)
Spoke with patient to f/u with him to see if he had talked with his wife about scheduling a visit with Palliative NP and he said that he did speak with her.  He said that he had a situation come up and he has an appointment with Dr. Sampson Goon this Thurs. 05/24/20 and he needs to discuss some things with him first before scheduling.  He stated that he still has my number and will call me back after the appointment.

## 2020-06-07 ENCOUNTER — Other Ambulatory Visit: Payer: Self-pay | Admitting: Infectious Diseases

## 2020-06-07 DIAGNOSIS — M8668 Other chronic osteomyelitis, other site: Secondary | ICD-10-CM

## 2020-06-07 DIAGNOSIS — H6020 Malignant otitis externa, unspecified ear: Secondary | ICD-10-CM

## 2020-06-08 ENCOUNTER — Ambulatory Visit: Admission: RE | Admit: 2020-06-08 | Payer: Medicare Other | Source: Ambulatory Visit

## 2020-06-27 ENCOUNTER — Ambulatory Visit
Admission: RE | Admit: 2020-06-27 | Discharge: 2020-06-27 | Disposition: A | Discharge status: Home / Self Care | Payer: Medicare Other | Source: Ambulatory Visit | Attending: Infectious Diseases | Admitting: Infectious Diseases

## 2020-06-27 ENCOUNTER — Other Ambulatory Visit: Payer: Self-pay

## 2020-06-27 DIAGNOSIS — M8668 Other chronic osteomyelitis, other site: Secondary | ICD-10-CM | POA: Diagnosis present

## 2020-06-27 DIAGNOSIS — H6020 Malignant otitis externa, unspecified ear: Secondary | ICD-10-CM | POA: Insufficient documentation

## 2020-06-27 LAB — POCT I-STAT CREATININE: Creatinine, Ser: 0.9 mg/dL (ref 0.61–1.24)

## 2020-06-27 MED ORDER — IOHEXOL 300 MG/ML  SOLN
75.0000 mL | Freq: Once | INTRAMUSCULAR | Status: AC | PRN
  Administered 2020-06-27: 75 mL via INTRAVENOUS

## 2020-06-28 ENCOUNTER — Telehealth: Payer: Self-pay | Admitting: Infectious Diseases

## 2020-06-28 NOTE — Telephone Encounter (Signed)
Dr Jenne Campus I just wanted to update you on our mutual patient.  He has remained off of antibiotics as he did not tolerate the ciprofloxacin and did not want to start levofloxacin.  He has been hesitant to restart IV antibiotics as well.  Clinically he remains about the same with few symptoms except the drainage.  His sed rate 3 weeks ago was 68 which was stable from about a month ago at 68.  His CRP is 18 and it was 14 a month ago (in our lab the upper limit of normal is 10 as opposed to 1 so this is not a very marked elevation).  I went ahead and ordered a repeat CT and would be curious if you could take a look at it.  The read says there is a decreased size of what they were calling might have been a small abscess in his nasopharynx but otherwise not much change from his CT August 30.  I remain puzzled by this case and not sure if we place another PICC and start him on prolonged antibiotics since he remained stable over the last few months and has been off antibiotics since October 4.  feel free to call me [phone].

## 2020-07-16 ENCOUNTER — Telehealth: Payer: Self-pay | Admitting: Adult Health Nurse Practitioner

## 2020-07-16 NOTE — Telephone Encounter (Signed)
Called patient's home # to f/u with him after his MD appointment to see if he wishes to pursue Palliative services or not, no answer - left message requesting a return call to let me know what he has decided to do.  Left my name and call back number.

## 2020-07-17 ENCOUNTER — Ambulatory Visit
Admission: RE | Admit: 2020-07-17 | Discharge: 2020-07-17 | Disposition: A | Discharge status: Home / Self Care | Payer: Self-pay | Source: Ambulatory Visit | Attending: Infectious Diseases | Admitting: Infectious Diseases

## 2020-07-17 ENCOUNTER — Ambulatory Visit
Admission: RE | Admit: 2020-07-17 | Discharge: 2020-07-17 | Disposition: A | Discharge status: Home / Self Care | Payer: Medicare Other | Source: Ambulatory Visit | Attending: Infectious Diseases | Admitting: Infectious Diseases

## 2020-07-17 ENCOUNTER — Other Ambulatory Visit: Payer: Self-pay

## 2020-07-17 DIAGNOSIS — M8668 Other chronic osteomyelitis, other site: Secondary | ICD-10-CM | POA: Diagnosis not present

## 2020-07-17 DIAGNOSIS — H6020 Malignant otitis externa, unspecified ear: Secondary | ICD-10-CM | POA: Diagnosis present

## 2020-07-17 MED ORDER — SODIUM CHLORIDE 0.9 % IV SOLN
2.0000 g | Freq: Once | INTRAVENOUS | Status: AC
  Administered 2020-07-17: 2 g via INTRAVENOUS
  Filled 2020-07-17 (×2): qty 2

## 2020-07-17 MED ORDER — SODIUM CHLORIDE 0.9% FLUSH: 10.0000 mL | Freq: Two times a day (BID) | INTRAVENOUS | Status: DC

## 2020-07-17 MED ORDER — SODIUM CHLORIDE 0.9% FLUSH: 10.0000 mL | INTRAVENOUS | Status: DC | PRN

## 2020-07-17 MED ORDER — CHLORHEXIDINE GLUCONATE CLOTH 2 % EX PADS: 6.0000 | MEDICATED_PAD | Freq: Every day | CUTANEOUS | Status: DC

## 2020-07-17 NOTE — Progress Notes (Signed)
Peripherally Inserted Central Catheter Placement  The IV Nurse has discussed with the patient and/or persons authorized to consent for the patient, the purpose of this procedure and the potential benefits and risks involved with this procedure.  The benefits include less needle sticks, lab draws from the catheter, and the patient may be discharged home with the catheter. Risks include, but not limited to, infection, bleeding, blood clot (thrombus formation), and puncture of an artery; nerve damage and irregular heartbeat and possibility to perform a PICC exchange if needed/ordered by physician.  Alternatives to this procedure were also discussed.  Bard Power PICC patient education guide, fact sheet on infection prevention and patient information card has been provided to patient /or left at bedside.    PICC Placement Documentation  PICC Single Lumen 03/16/20 PICC Right Basilic 43 cm 0 cm (Active)     PICC Single Lumen 07/17/20 PICC Right Brachial 46 cm 0 cm (Active)  Indication for Insertion or Continuance of Line Home intravenous therapies (PICC only) 07/17/20 1429  Exposed Catheter (cm) 0 cm 07/17/20 1429  Site Assessment Clean;Dry;Intact 07/17/20 1429  Line Status Flushed;Saline locked;Blood return noted 07/17/20 1429  Dressing Type Transparent;Securing device 07/17/20 1429  Dressing Status Clean;Dry;Intact 07/17/20 1429  Antimicrobial disc in place? Yes 07/17/20 1429  Safety Lock Not Applicable 07/17/20 1429  Dressing Intervention New dressing 07/17/20 1429  Dressing Change Due 07/24/20 07/17/20 1429       Annett Fabian 07/17/2020, 2:30 PM

## 2020-07-25 ENCOUNTER — Telehealth: Payer: Self-pay | Admitting: Adult Health Nurse Practitioner

## 2020-07-27 NOTE — Telephone Encounter (Signed)
Called patient's home number to schedule a Palliative Consult, no answer - left message requesting a return call, left my name and call back number.

## 2021-06-25 ENCOUNTER — Other Ambulatory Visit: Payer: Self-pay

## 2021-06-25 ENCOUNTER — Ambulatory Visit (INDEPENDENT_AMBULATORY_CARE_PROVIDER_SITE_OTHER): Payer: Medicare Other | Admitting: Podiatry

## 2021-06-25 ENCOUNTER — Encounter: Payer: Self-pay | Admitting: Podiatry

## 2021-06-25 DIAGNOSIS — M79675 Pain in left toe(s): Secondary | ICD-10-CM

## 2021-06-25 DIAGNOSIS — M79674 Pain in right toe(s): Secondary | ICD-10-CM | POA: Diagnosis not present

## 2021-06-25 DIAGNOSIS — B351 Tinea unguium: Secondary | ICD-10-CM | POA: Diagnosis not present

## 2021-06-25 DIAGNOSIS — E1165 Type 2 diabetes mellitus with hyperglycemia: Secondary | ICD-10-CM

## 2021-06-25 NOTE — Progress Notes (Signed)
  Subjective:  Patient ID: Daniel Sanders, male    DOB: [birth year],  MRN: [id number]  Chief Complaint  Patient presents with   Nail Problem    Nail trim    75 y.o. male returns for the above complaint.  Patient presents with thickened elongated dystrophic toenails x10.  Mild pain on palpation.  Patient states that he he is not able to debride his own nails.  He would like for me to do it.  He is a diabetic with unknown A1c.  He denies any other acute issues  Objective:  There were no vitals filed for this visit. Podiatric Exam: Vascular: dorsalis pedis and posterior tibial pulses are palpable bilateral. Capillary return is immediate. Temperature gradient is WNL. Skin turgor WNL  Sensorium: Normal Semmes Weinstein monofilament test. Normal tactile sensation bilaterally. Nail Exam: Pt has thick disfigured discolored nails with subungual debris noted bilateral entire nail hallux through fifth toenails.  Pain on palpation to the nails. Ulcer Exam: There is no evidence of ulcer or pre-ulcerative changes or infection. Orthopedic Exam: Muscle tone and strength are WNL. No limitations in general ROM. No crepitus or effusions noted.  Skin: No Porokeratosis. No infection or ulcers    Assessment & Plan:   1. Uncontrolled type 2 diabetes mellitus with hyperglycemia (HCC)   2. Pain due to onychomycosis of toenails of both feet     Patient was evaluated and treated and all questions answered.  Onychomycosis with pain  -Nails palliatively debrided as below. -Educated on self-care  Procedure: Nail Debridement Rationale: pain  Type of Debridement: manual, sharp debridement. Instrumentation: Nail nipper, rotary burr. Number of Nails: 10 pain onychomycosis feet  Procedures and Treatment: Consent by patient was obtained for treatment procedures. The patient understood the discussion of treatment and procedures well. All questions were answered thoroughly reviewed. Debridement of mycotic and  hypertrophic toenails, 1 through 5 bilateral and clearing of subungual debris. No ulceration, no infection noted.  Return Visit-Office Procedure: Patient instructed to return to the office for a follow up visit 3 months for continued evaluation and treatment.  Nicholes Rough, DPM    No follow-ups on file.

## 2021-07-01 DIAGNOSIS — I878 Other specified disorders of veins: Secondary | ICD-10-CM | POA: Insufficient documentation

## 2021-10-03 ENCOUNTER — Ambulatory Visit (INDEPENDENT_AMBULATORY_CARE_PROVIDER_SITE_OTHER): Payer: Medicare Other | Admitting: Podiatry

## 2021-10-03 ENCOUNTER — Encounter: Payer: Self-pay | Admitting: Podiatry

## 2021-10-03 ENCOUNTER — Other Ambulatory Visit: Payer: Self-pay

## 2021-10-03 DIAGNOSIS — M79674 Pain in right toe(s): Secondary | ICD-10-CM

## 2021-10-03 DIAGNOSIS — E1165 Type 2 diabetes mellitus with hyperglycemia: Secondary | ICD-10-CM | POA: Diagnosis not present

## 2021-10-03 DIAGNOSIS — B351 Tinea unguium: Secondary | ICD-10-CM

## 2021-10-03 DIAGNOSIS — M79675 Pain in left toe(s): Secondary | ICD-10-CM | POA: Diagnosis not present

## 2021-10-03 DIAGNOSIS — I872 Venous insufficiency (chronic) (peripheral): Secondary | ICD-10-CM | POA: Diagnosis not present

## 2021-10-03 NOTE — Progress Notes (Signed)
This patient returns to my office for at risk foot care.  This patient requires this care by a professional since this patient will be at risk due to having uncontrolled diabetes and venous insufficiency.  This patient is unable to cut nails himself since the patient cannot reach his nails.These nails are painful walking and wearing shoes.  This patient presents for at risk foot care today.  General Appearance  Alert, conversant and in no acute stress.  Vascular  Dorsalis pedis and posterior tibial  pulses are palpable  bilaterally.  Capillary return is within normal limits  bilaterally. Temperature is within normal limits  bilaterally.  Neurologic  Senn-Weinstein monofilament wire test within normal limits  bilaterally. Muscle power within normal limits bilaterally.  Nails Thick disfigured discolored nails with subungual debris  from hallux to fifth toes bilaterally. No evidence of bacterial infection or drainage bilaterally.  Orthopedic  No limitations of motion  feet .  No crepitus or effusions noted.  No bony pathology or digital deformities noted.  Skin  normotropic skin with no porokeratosis noted bilaterally.  No signs of infections or ulcers noted.     Onychomycosis  Pain in right toes  Pain in left toes  Consent was obtained for treatment procedures.   Mechanical debridement of nails 1-5  bilaterally performed with a nail nipper.  Filed with dremel without incident.    Return office visit  3 months                    Told patient to return for periodic foot care and evaluation due to potential at risk complications.   Gardiner Barefoot DPM

## 2021-10-14 ENCOUNTER — Encounter: Payer: Self-pay | Admitting: Ophthalmology

## 2021-10-17 NOTE — Discharge Instructions (Signed)

## 2021-10-21 ENCOUNTER — Ambulatory Visit: Payer: Medicare Other | Admitting: Anesthesiology

## 2021-10-21 ENCOUNTER — Encounter
Admission: RE | Disposition: A | Discharge status: Home / Self Care | Payer: Self-pay | Source: Home / Self Care | Attending: Ophthalmology

## 2021-10-21 ENCOUNTER — Ambulatory Visit
Admission: RE | Admit: 2021-10-21 | Discharge: 2021-10-21 | Disposition: A | Discharge status: Home / Self Care | Payer: Medicare Other | Attending: Ophthalmology | Admitting: Ophthalmology

## 2021-10-21 ENCOUNTER — Encounter: Payer: Self-pay | Admitting: Ophthalmology

## 2021-10-21 ENCOUNTER — Other Ambulatory Visit: Payer: Self-pay

## 2021-10-21 DIAGNOSIS — Z794 Long term (current) use of insulin: Secondary | ICD-10-CM | POA: Diagnosis not present

## 2021-10-21 DIAGNOSIS — H2511 Age-related nuclear cataract, right eye: Secondary | ICD-10-CM | POA: Insufficient documentation

## 2021-10-21 DIAGNOSIS — G4730 Sleep apnea, unspecified: Secondary | ICD-10-CM | POA: Insufficient documentation

## 2021-10-21 DIAGNOSIS — Z87891 Personal history of nicotine dependence: Secondary | ICD-10-CM | POA: Insufficient documentation

## 2021-10-21 DIAGNOSIS — E1169 Type 2 diabetes mellitus with other specified complication: Secondary | ICD-10-CM | POA: Diagnosis not present

## 2021-10-21 DIAGNOSIS — I10 Essential (primary) hypertension: Secondary | ICD-10-CM | POA: Insufficient documentation

## 2021-10-21 DIAGNOSIS — Z6841 Body Mass Index (BMI) 40.0 and over, adult: Secondary | ICD-10-CM | POA: Diagnosis not present

## 2021-10-21 DIAGNOSIS — E1136 Type 2 diabetes mellitus with diabetic cataract: Secondary | ICD-10-CM | POA: Insufficient documentation

## 2021-10-21 DIAGNOSIS — E785 Hyperlipidemia, unspecified: Secondary | ICD-10-CM | POA: Diagnosis not present

## 2021-10-21 HISTORY — DX: Presence of external hearing-aid: Z97.4

## 2021-10-21 HISTORY — DX: Presence of dental prosthetic device (complete) (partial): Z97.2

## 2021-10-21 HISTORY — PX: CATARACT EXTRACTION W/PHACO: SHX586

## 2021-10-21 LAB — GLUCOSE, CAPILLARY
Glucose-Capillary: 167 mg/dL — ABNORMAL HIGH (ref 70–99)
Glucose-Capillary: 165 mg/dL — ABNORMAL HIGH (ref 70–99)

## 2021-10-21 SURGERY — PHACOEMULSIFICATION, CATARACT, WITH IOL INSERTION
Anesthesia: Monitor Anesthesia Care | Site: Eye | Laterality: Right

## 2021-10-21 MED ORDER — MOXIFLOXACIN HCL 0.5 % OP SOLN
OPHTHALMIC | Status: DC | PRN
  Administered 2021-10-21: 0.2 mL via OPHTHALMIC

## 2021-10-21 MED ORDER — LIDOCAINE HCL (PF) 2 % IJ SOLN
INTRAOCULAR | Status: DC | PRN
  Administered 2021-10-21: 1 mL via INTRAOCULAR

## 2021-10-21 MED ORDER — SIGHTPATH DOSE#1 BSS IO SOLN
INTRAOCULAR | Status: DC | PRN
  Administered 2021-10-21: 15 mL

## 2021-10-21 MED ORDER — LACTATED RINGERS IV SOLN: INTRAVENOUS | Status: DC

## 2021-10-21 MED ORDER — ARMC OPHTHALMIC DILATING DROPS
1.0000 "application " | OPHTHALMIC | Status: DC | PRN
  Administered 2021-10-21 (×3): 1 via OPHTHALMIC

## 2021-10-21 MED ORDER — SIGHTPATH DOSE#1 SODIUM HYALURONATE 10 MG/ML IO SOLUTION
PREFILLED_SYRINGE | INTRAOCULAR | Status: DC | PRN
  Administered 2021-10-21: 0.85 mL via INTRAOCULAR

## 2021-10-21 MED ORDER — SIGHTPATH DOSE#1 BSS IO SOLN
INTRAOCULAR | Status: DC | PRN
  Administered 2021-10-21: 97 mL via OPHTHALMIC

## 2021-10-21 MED ORDER — MIDAZOLAM HCL 2 MG/2ML IJ SOLN
INTRAMUSCULAR | Status: DC | PRN
Start: 2021-10-21 — End: 2021-10-21
  Administered 2021-10-21: 2 mg via INTRAVENOUS

## 2021-10-21 MED ORDER — FENTANYL CITRATE (PF) 100 MCG/2ML IJ SOLN
INTRAMUSCULAR | Status: DC | PRN
Start: 2021-10-21 — End: 2021-10-21
  Administered 2021-10-21: 50 ug via INTRAVENOUS

## 2021-10-21 MED ORDER — SIGHTPATH DOSE#1 SODIUM HYALURONATE 23 MG/ML IO SOLUTION
PREFILLED_SYRINGE | INTRAOCULAR | Status: DC | PRN
Start: 2021-10-21 — End: 2021-10-21
  Administered 2021-10-21: 0.6 mL via INTRAOCULAR

## 2021-10-21 MED ORDER — TETRACAINE HCL 0.5 % OP SOLN
1.0000 [drp] | OPHTHALMIC | Status: DC | PRN
  Administered 2021-10-21 (×3): 1 [drp] via OPHTHALMIC

## 2021-10-21 SURGICAL SUPPLY — 14 items
CATARACT SUITE SIGHTPATH (MISCELLANEOUS) ×2 IMPLANT
DISSECTOR HYDRO NUCLEUS 50X22 (MISCELLANEOUS) ×2 IMPLANT
FEE CATARACT SUITE SIGHTPATH (MISCELLANEOUS) ×1 IMPLANT
GLOVE SURG GAMMEX PI TX LF 7.5 (GLOVE) ×2 IMPLANT
GLOVE SURG SYN 8.5  E (GLOVE) ×1
GLOVE SURG SYN 8.5 E (GLOVE) ×1 IMPLANT
GLOVE SURG SYN 8.5 PF PI (GLOVE) ×1 IMPLANT
LENS IOL TECNIS EYHANCE 17.0 (Intraocular Lens) ×1 IMPLANT
NDL FILTER BLUNT 18X1 1/2 (NEEDLE) ×1 IMPLANT
NEEDLE FILTER BLUNT 18X 1/2SAF (NEEDLE) ×1
NEEDLE FILTER BLUNT 18X1 1/2 (NEEDLE) ×1 IMPLANT
SYR 3ML LL SCALE MARK (SYRINGE) ×2 IMPLANT
SYR 5ML LL (SYRINGE) ×2 IMPLANT
WATER STERILE IRR 250ML POUR (IV SOLUTION) ×2 IMPLANT

## 2021-10-21 NOTE — Anesthesia Postprocedure Evaluation (Signed)
Anesthesia Post Note ? ?Patient: Daniel Sanders ? ?Procedure(s) Performed: CATARACT EXTRACTION PHACO AND INTRAOCULAR LENS PLACEMENT (IOC) RIGHT DIABETIC (Right: Eye) ? ? ?  ?Patient location during evaluation: PACU ?Anesthesia Type: MAC ?Level of consciousness: awake and alert and oriented ?Pain management: satisfactory to patient ?Vital Signs Assessment: post-procedure vital signs reviewed and stable ?Respiratory status: spontaneous breathing, nonlabored ventilation and respiratory function stable ?Cardiovascular status: blood pressure returned to baseline and stable ?Postop Assessment: Adequate PO intake and No signs of nausea or vomiting ?Anesthetic complications: no ? ? ?No notable events documented. ? ?Raliegh Ip ? ? ? ? ? ?

## 2021-10-21 NOTE — Anesthesia Procedure Notes (Signed)
Procedure Name: [place] ?Date/Time: 10/21/2021 8:06 AM ?Performed by: Jeannene Patella, CRNA ?Pre-anesthesia Checklist: Patient identified, Emergency Drugs available, Suction available, Timeout performed and Patient being monitored ?Patient Re-evaluated:Patient Re-evaluated prior to induction ?Oxygen Delivery Method: Nasal cannula ?Placement Confirmation: positive ETCO2 ? ? ? ? ?

## 2021-10-21 NOTE — Anesthesia Preprocedure Evaluation (Signed)
Anesthesia Evaluation  ?Patient identified by MRN, date of birth, ID band ?Patient awake ? ? ? ?Reviewed: ?Allergy & Precautions, H&P , NPO status , Patient's Chart, lab work & pertinent test results ? ?Airway ?Mallampati: II ? ?TM Distance: >3 FB ?Neck ROM: full ? ? ? Dental ? ?(+) Upper Dentures, Lower Dentures ?  ?Pulmonary ?sleep apnea , former smoker,  ?  ?Pulmonary exam normal ?breath sounds clear to auscultation ? ? ? ? ? ? Cardiovascular ?hypertension, Normal cardiovascular exam ?Rhythm:regular Rate:Normal ? ? ?  ?Neuro/Psych ?  ? GI/Hepatic ?  ?Endo/Other  ?diabetes, Insulin DependentMorbid obesity ? Renal/GU ?  ? ?  ?Musculoskeletal ? ? Abdominal ?  ?Peds ? Hematology ?  ?Anesthesia Other Findings ? ? Reproductive/Obstetrics ? ?  ? ? ? ? ? ? ? ? ? ? ? ? ? ?  ?  ? ? ? ? ? ? ? ? ?Anesthesia Physical ?Anesthesia Plan ? ?ASA: 3 ? ?Anesthesia Plan: MAC  ? ?Post-op Pain Management: Minimal or no pain anticipated  ? ?Induction:  ? ?PONV Risk Score and Plan: 1 and Treatment may vary due to age or medical condition, TIVA and Midazolam ? ?Airway Management Planned:  ? ?Additional Equipment:  ? ?Intra-op Plan:  ? ?Post-operative Plan:  ? ?Informed Consent: I have reviewed the patients History and Physical, chart, labs and discussed the procedure including the risks, benefits and alternatives for the proposed anesthesia with the patient or authorized representative who has indicated his/her understanding and acceptance.  ? ? ? ?Dental Advisory Given ? ?Plan Discussed with: CRNA ? ?Anesthesia Plan Comments:   ? ? ? ? ? ? ?Anesthesia Quick Evaluation ? ?

## 2021-10-21 NOTE — Transfer of Care (Signed)
Immediate Anesthesia Transfer of Care Note ? ?Patient: Daniel Sanders ? ?Procedure(s) Performed: CATARACT EXTRACTION PHACO AND INTRAOCULAR LENS PLACEMENT (IOC) RIGHT DIABETIC (Right: Eye) ? ?Patient Location: PACU ? ?Anesthesia Type: MAC ? ?Level of Consciousness: awake, alert  and patient cooperative ? ?Airway and Oxygen Therapy: Patient Spontanous Breathing and Patient connected to supplemental oxygen ? ?Post-op Assessment: Post-op Vital signs reviewed, Patient's Cardiovascular Status Stable, Respiratory Function Stable, Patent Airway and No signs of Nausea or vomiting ? ?Post-op Vital Signs: Reviewed and stable ? ?Complications: No notable events documented. ? ?

## 2021-10-21 NOTE — Op Note (Signed)
OPERATIVE NOTE ? ?Daniel Sanders ?[id number] ?10/21/2021 ? ? ?PREOPERATIVE DIAGNOSIS:  Nuclear sclerotic cataract right eye.  H25.11 ?  ?POSTOPERATIVE DIAGNOSIS:    Nuclear sclerotic cataract right eye.   ?  ?PROCEDURE:  Phacoemusification with posterior chamber intraocular lens placement of the right eye  ? ?LENS:   ?Implant Name Type Inv. Item Serial No. Manufacturer Lot No. LRB No. Used Action  ?LENS IOL TECNIS EYHANCE 17.0 - [id number] Intraocular Lens LENS IOL TECNIS EYHANCE 17.0 [id number] [employer]  Right 1 Implanted  ?    ? ?Procedure(s) with comments: ?CATARACT EXTRACTION PHACO AND INTRAOCULAR LENS PLACEMENT (IOC) RIGHT DIABETIC (Right) - Diabetic ?5.33 ?00:45.6 ? ?DIB00 +17.0 ?  ?ULTRASOUND TIME: 0 minutes 45 seconds.  CDE 5.33 ?  ?SURGEON:  Benay Pillow, MD, MPH ? ?ANESTHESIOLOGIST: Anesthesiologist: Ronelle Nigh, MD ?CRNA: Jeannene Patella, CRNA ?  ?ANESTHESIA:  Topical with tetracaine drops augmented with 1% preservative-free intracameral lidocaine. ? ?ESTIMATED BLOOD LOSS: less than 1 mL. ?  ?COMPLICATIONS:  None. ?  ?DESCRIPTION OF PROCEDURE:  The patient was identified in the holding room and transported to the operating room and placed in the supine position under the operating microscope.  The right eye was identified as the operative eye and it was prepped and draped in the usual sterile ophthalmic fashion. ?  ?A 1.0 millimeter clear-corneal paracentesis was made at the 10:30 position. 0.5 ml of preservative-free 1% lidocaine with epinephrine was injected into the anterior chamber. ? The anterior chamber was filled with Healon 5 viscoelastic.  A 2.4 millimeter keratome was used to make a near-clear corneal incision at the 8:00 position.  A curvilinear capsulorrhexis was made with a cystotome and capsulorrhexis forceps.  Balanced salt solution was used to hydrodissect and hydrodelineate the nucleus. ?  ?Phacoemulsification was then used in stop and chop fashion to remove the lens nucleus and  epinucleus.  The remaining cortex was then removed using the irrigation and aspiration handpiece. Healon was then placed into the capsular bag to distend it for lens placement.  A lens was then injected into the capsular bag.  The remaining viscoelastic was aspirated. ?  ?Wounds were hydrated with balanced salt solution.  The anterior chamber was inflated to a physiologic pressure with balanced salt solution.  ? ?Intracameral vigamox 0.1 mL undiluted was injected into the eye and a drop placed onto the ocular surface. ? ?The lens was well centered in good position.   There was notable vitreous hemorrhage observable in the anterior vitreous, behind the intact capsule. ? ?No wound leaks were noted.  The patient was taken to the recovery room in stable condition without complications of anesthesia or surgery ? ?Benay Pillow ?10/21/2021, 8:24 AM ? ?

## 2021-10-21 NOTE — H&P (Signed)
[facility]  ? ?Primary Care Physician:  Jenell Milliner, MD ?Ophthalmologist: Dr. Willey Blade ? ?Pre-Procedure History & Physical: ?HPI:  Daniel Sanders is a 76 y.o. male here for cataract surgery. ?  ?Past Medical History:  ?Diagnosis Date  ? Arthritis   ? Diabetes mellitus without complication (HCC)   ? Hyperlipidemia associated with type 2 diabetes mellitus (HCC)   ? Hypertension   ? Obesity   ? Sleep apnea   ? per patient, incorrect  ? Venous insufficiency of both lower extremities 2021  ? Wears dentures   ? partial upper and lower  ? Wears hearing aid in both ears   ? ? ?Past Surgical History:  ?Procedure Laterality Date  ? MASS EXCISION Right 10/11/2019  ? Procedure: EXCISION EAR CANAL MASS;  Surgeon: Linus Salmons, MD;  Location: [facility] ORS;  Service: ENT;  Laterality: Right;  ? ? ?Prior to Admission medications   ?Medication Sig Start Date End Date Taking? Authorizing Provider  ?amLODipine (NORVASC) 5 MG tablet Take by mouth. 08/24/21 08/24/22 Yes [provider]  ?aspirin EC 81 MG tablet Take 81 mg by mouth daily.   Yes [provider]  ?atorvastatin (LIPITOR) 20 MG tablet Take by mouth. 05/24/19  Yes [provider]  ?B12-ACTIVE 1 MG CHEW  09/26/21  Yes [provider]  ?furosemide (LASIX) 40 MG tablet Take 40 mg by mouth every evening. 05/23/19  Yes [provider]  ?lisinopril (ZESTRIL) 40 MG tablet Take 1 tablet (40 mg total) by mouth daily. 03/19/20  Yes Enedina Finner, MD  ?metFORMIN (GLUCOPHAGE) 1000 MG tablet Take 1,000 mg by mouth 2 (two) times daily. 06/30/19  Yes [provider]  ?NOVOLIN N 100 UNIT/ML injection Inject 0.38 mLs (38 Units total) into the skin 2 (two) times daily before a meal. Inject 70 units subcutaneously in the morning & inject 60 units subcutaneously in the evening 03/19/20  Yes Enedina Finner, MD  ?NOVOLIN R 100 UNIT/ML injection Inject 0.12 mLs (12 Units total) into the skin 3 (three) times daily before meals. Inject 25 units  subcutaneously in the morning & inject 20 units subcutaneously in the evening 03/19/20  Yes Enedina Finner, MD  ?tobramycin-dexamethasone [facility]) ophthalmic solution Place 4 drops into the right ear 2 (two) times daily. 03/19/20  Yes Enedina Finner, MD  ?Insulin Syringe-Needle U-100 (GLOBAL INJECT EASE INSULIN SYR) 30G X 1/2" 1 ML MISC 1 each by Other route Two (2) times a day (30 minutes before a meal). 03/20/20   [provider]  ? ? ?Allergies as of 09/27/2021  ? (No Known Allergies)  ? ? ?History reviewed. No pertinent family history. ? ?Social History  ? ?Socioeconomic History  ? Marital status: Married  ?  Spouse name: Kathie Rhodes  ? Number of children: Not on file  ? Years of education: Not on file  ? Highest education level: Not on file  ?Occupational History  ? 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 Smoking status: Former  ?  Types: Cigarettes, Pipe  ?  Quit date: 1995  ?  Years since quitting: 28.1  ? Smokeless tobacco: Former  ?  Quit date: 31  ?Vaping Use  ? Vaping Use: Never used  ?Substance and Sexual Activity  ? Alcohol use: Never  ?  Comment: nothing for 36 years  ? Drug use: Not on file  ?  Comment: nothing in over 30 years  ? Sexual activity: Not on file  ?  Other Topics Concern  ? Not on file  ?Social History Narrative  ? Not on file  ? ?Social Determinants of Health  ? ?Financial Resource Strain: Not on file  ?Food Insecurity: Not on file  ?Transportation Needs: Not on file  ?Physical Activity: Not on file  ?Stress: Not on file  ?Social Connections: Not on file  ?Intimate Partner Violence: Not on file  ? ? ?Review of Systems: ?See HPI, otherwise negative ROS ? ?Physical Exam: ?BP (!) 139/93   Pulse 88   Temp (!) 97.3 ?F (36.3 ?C) (Temporal)   Resp 18   Ht 6' (1.829 m)   Wt (!) 136.5 kg   SpO2 98%   BMI 40.82 kg/m?  ?General:   Alert, cooperative in NAD ?Head:  Normocephalic and atraumatic. ?Respiratory:  Normal work of breathing. ?Cardiovascular:   RRR ? ?Impression/Plan: ?Daniel Sanders is here for cataract surgery. ? ?Risks, benefits, limitations, and alternatives regarding cataract surgery have been reviewed with the patient.  Questions have been answered.  All parties agreeable. ? ? ?Willey Blade, MD  10/21/2021, 7:56 AM ? ? ?

## 2021-10-22 ENCOUNTER — Encounter: Payer: Self-pay | Admitting: Ophthalmology

## 2022-01-02 ENCOUNTER — Ambulatory Visit: Payer: Medicare Other | Admitting: Podiatry

## 2022-01-23 DIAGNOSIS — H3321 Serous retinal detachment, right eye: Secondary | ICD-10-CM | POA: Insufficient documentation

## 2022-01-23 DIAGNOSIS — Z8739 Personal history of other diseases of the musculoskeletal system and connective tissue: Secondary | ICD-10-CM | POA: Insufficient documentation

## 2022-04-26 IMAGING — MR MR BRAIN/IAC WO/W CM
9 of 14 series · 24 of 48 positions shown · IV contrast (gadavist)
Comparison: Correlation made with prior CT imaging

CLINICAL DATA: Mastoiditis; history of right ear cholesteatoma and
polyp post excision in [REDACTED]

EXAM:
MRI HEAD WITHOUT AND WITH CONTRAST
TECHNIQUE: Multiplanar, multiecho pulse sequences of the brain and surrounding
structures were obtained without and with intravenous contrast.
CONTRAST:  10mL GADAVIST GADOBUTROL 1 MMOL/ML IV SOLN

[Series 5: T1 · sagittal · 5.0mm · 0.62mm/px · 3 of 23 slices shown (1 of 3)]
[im 1/23]
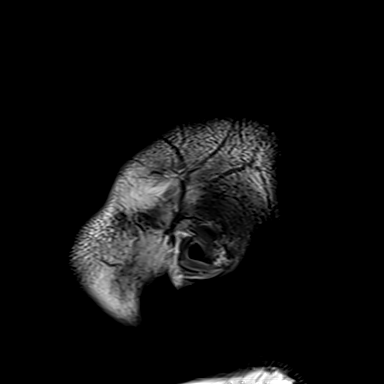
[im 12/23]
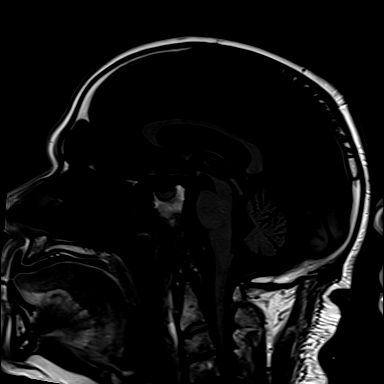
[im 23/23]
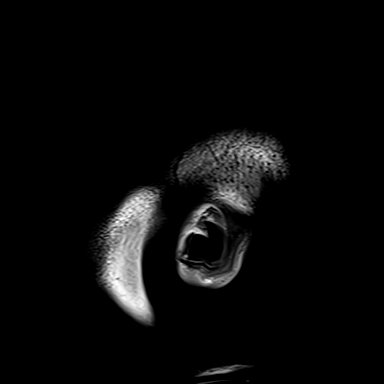

[Series 6: ax dwi_tracew · axial · 3.0mm · 0.60mm/px · z∈[-108,-33]mm · 2 of 48 slices shown]
[im 1/48]
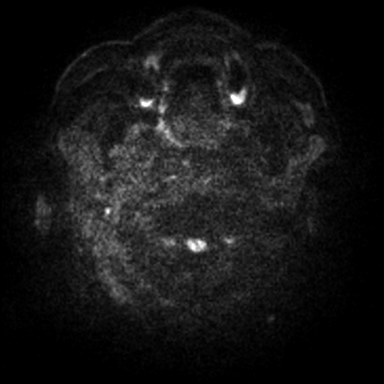
[im 24/48]
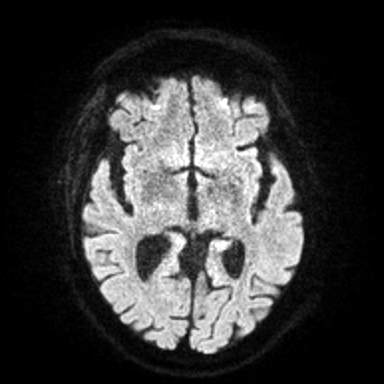

[Series 8: T2 · axial · 5.0mm · 0.53mm/px · z∈[-102,+41]mm · 2 of 25 slices shown]
[im 1/25]
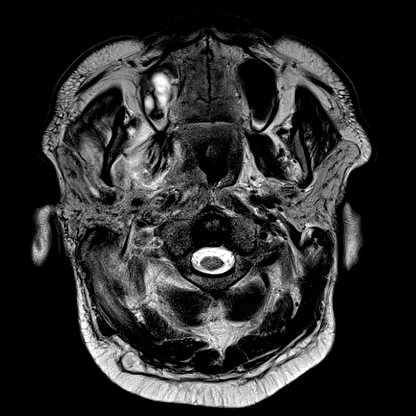
[im 25/25]
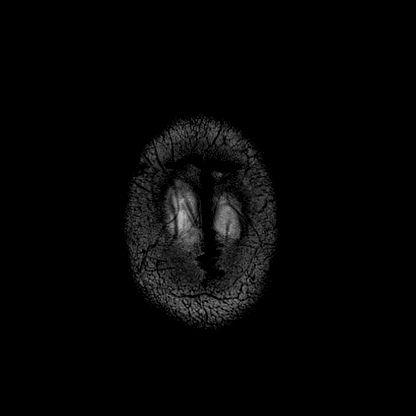

[Series 13: T1 · coronal · non-contrast · 3.0mm · 0.21mm/px · 1 of 15 slices shown (2 of 3)]
[im 1/15]
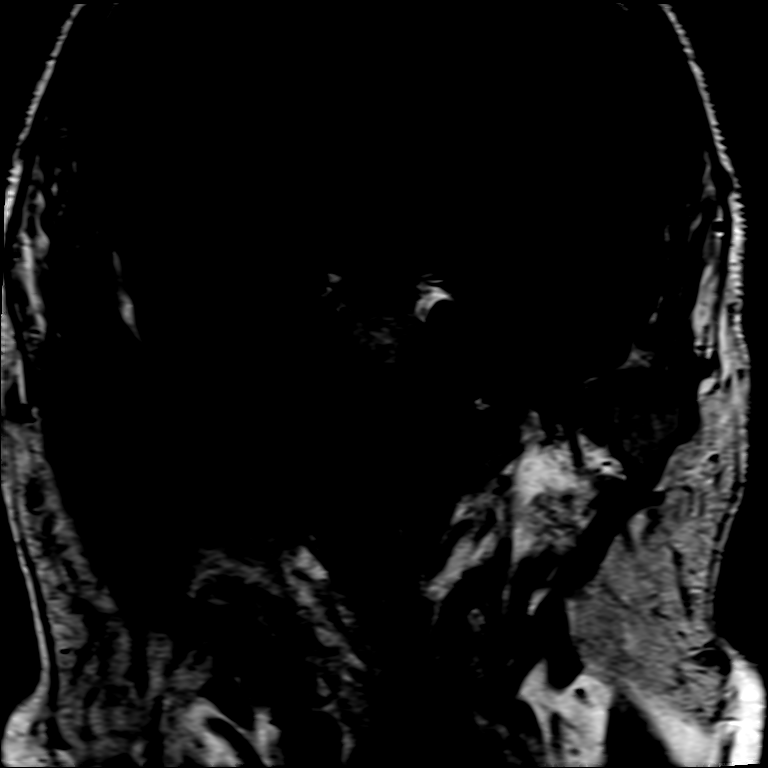

[Series 14: FLAIR · axial · 3.0mm · 0.53mm/px · z∈[-111,+50]mm · 4 of 55 slices shown]
[im 1/55]
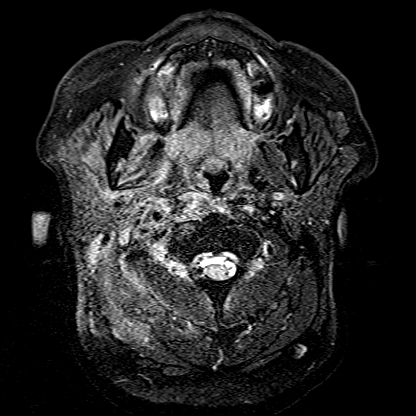
[im 19/55]
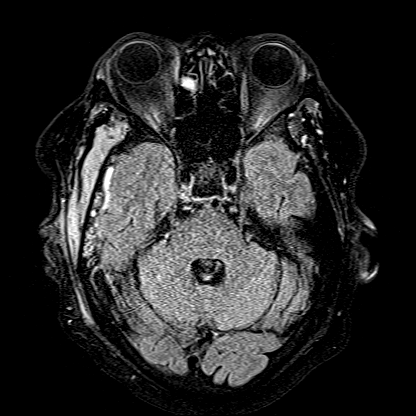
[im 37/55]
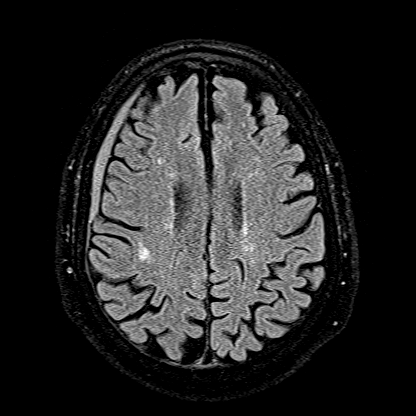
[im 55/55]
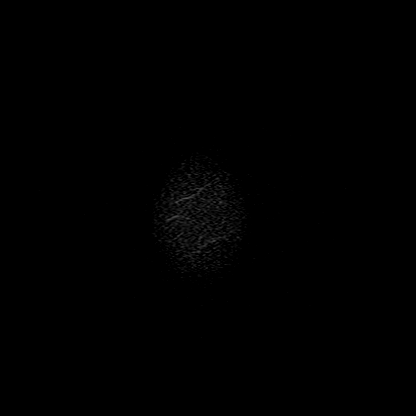

[Series 16: T1 · axial · non-contrast · 3.0mm · 0.21mm/px · 1 of 18 slices shown (3 of 3)]
[im 1/18]
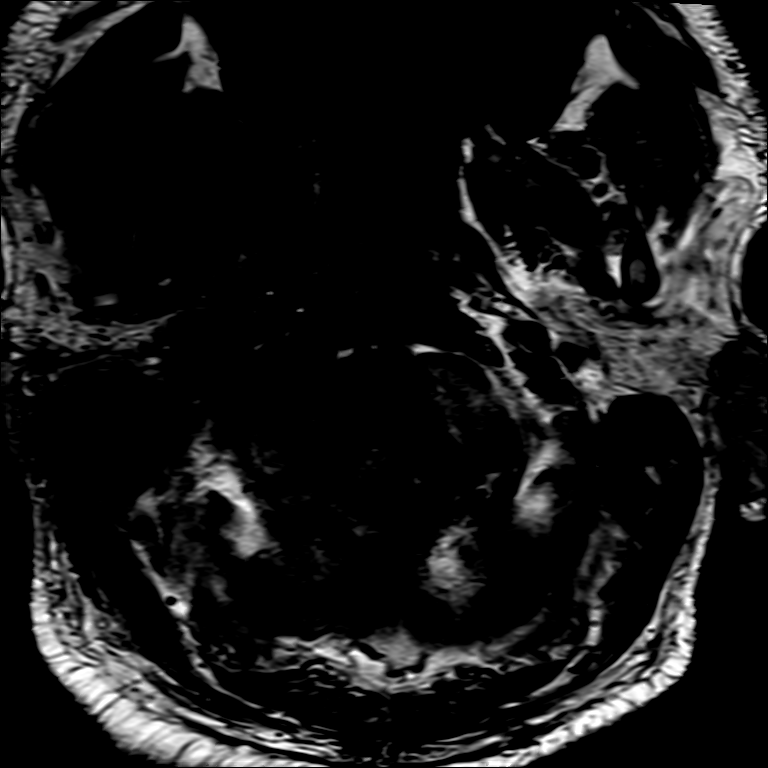

[Series 17: T1 post-contrast · axial · 3.0mm · 0.21mm/px · 1 of 18 slices shown (1 of 3)]
[im 1/18]
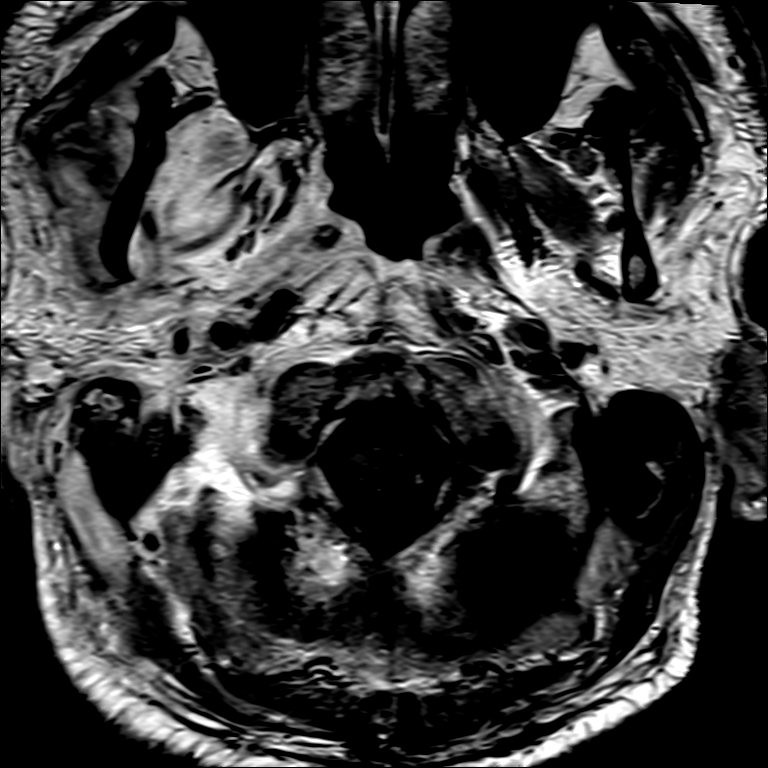

[Series 18: T1 post-contrast · coronal · 3.0mm · 0.21mm/px · 1 of 15 slices shown (2 of 3)]
[im 1/15]
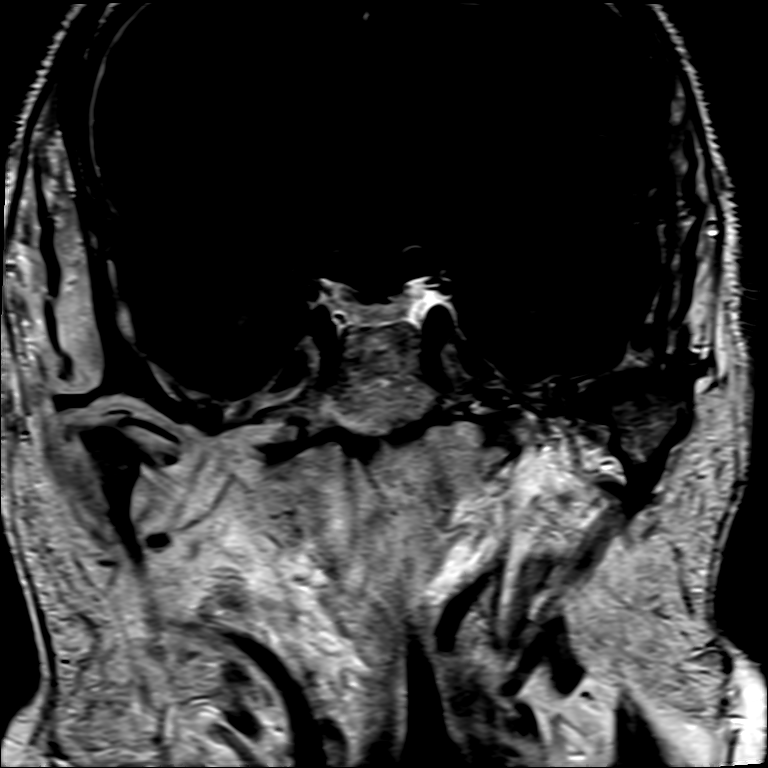

[Series 19: T1 post-contrast · axial · 1.0mm · 0.98mm/px · z∈[-119,+55]mm · 9 of 176 slices shown (3 of 3)]
[im 1/176]
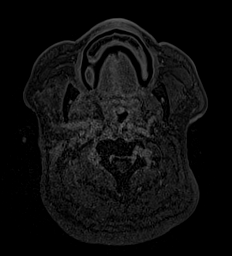
[im 32/176]
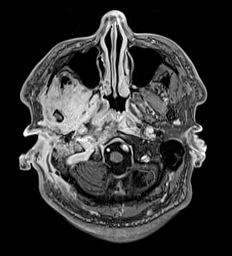
[im 48/176]
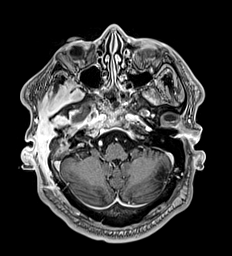
[im 80/176]
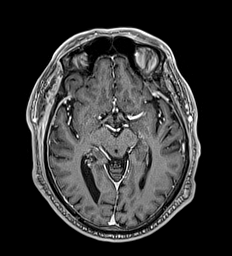
[im 96/176]
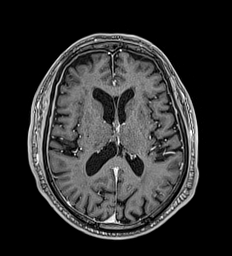
[im 128/176]
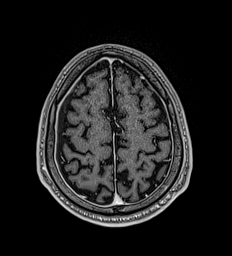
[im 144/176]
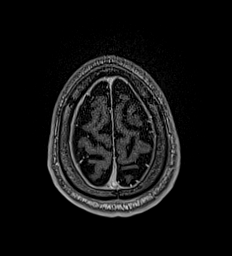
[im 160/176]
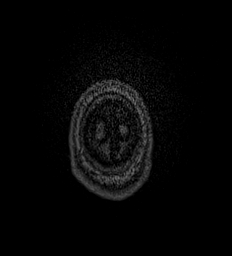
[im 176/176]
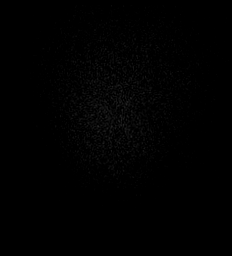

[24 of 48 positions shown; findings below may reference images not displayed]

FINDINGS: Brain: Inner ear structures demonstrate an unremarkable MR
appearance. There is no abnormal enhancement within the internal
auditory canals.

No acute infarction or intracranial hemorrhage. There is no mass
effect or edema. Prominence of the ventricles and sulci reflects
generalized parenchymal volume loss. Patchy foci of T2
hyperintensity in the supratentorial white matter nonspecific but
may reflect mild to moderate chronic microvascular ischemic changes.

There is a thin extra-axial collection along the right cerebral
convexity measuring up to 6 mm in thickness. This was
hypoattenuating on the prior head CT. There is corresponding T1
hyperintensity and susceptibility along the temporal convexity,
which may reflect more blood products.

Vascular: Major vessel flow voids at the skull base are preserved.

Skull and upper cervical spine: See below.

Sinuses/Orbits: Paranasal sinuses are clear. The orbits are
unremarkable.

Other: Right mastoid and middle ear opacification. Suggestion of
peripheral enhancement within the mastoid. Right external auditory
canal is opacified. There is abnormal signal and enhancement
superficial to the right mastoid extending anteriorly into the
temporal scalp and masticator, carotid, and parapharyngeal spaces at
the level of the nasopharynx. Small volume fluid superficial to the
mastoid demonstrates peripheral enhancement. See saved images series
17. There is also involvement of the right posterolateral
nasopharyngeal wall.

Right ICA flow void is preserved in this region. Diminished
nondominant right vertebral artery flow void in this region may
reflect slow flow.

There is abnormal marrow signal and enhancement at the skull base
involving the clivus and occipital condyle on the right and likely
involving the right jugular and hypoglossal foramina. There is
abnormal signal of the included right mandibular condyle, which
appears eroded. Additional erosive changes are better seen CT.

Right sigmoid sinus is patent.
IMPRESSION: Similar findings to prior neck CT detailed above involving right
mastoid and middle ear, masticator/carotid/parapharyngeal spaces,
skull base, and right mandibular condyle. Although malignancy is a
consideration, severe infection in the setting of a diabetic patient
is favored. Peripheral enhancement within the mastoid as well as
about small volume fluid superficial to the mastoid may reflect
abscess formation.

Thin right cerebral convexity subdural hematoma without significant
mass effect. Suspected more subacute blood products along the right
temporal convexity; empyema is considered less likely given presence
of corresponding susceptibility which reflects blood products.

## 2022-12-18 ENCOUNTER — Ambulatory Visit: Payer: Medicare Other | Admitting: Podiatry

## 2023-01-05 ENCOUNTER — Other Ambulatory Visit: Payer: Self-pay | Admitting: *Deleted

## 2023-01-05 ENCOUNTER — Ambulatory Visit (INDEPENDENT_AMBULATORY_CARE_PROVIDER_SITE_OTHER): Payer: Medicare Other | Admitting: Podiatry

## 2023-01-05 VITALS — BP 146/74

## 2023-01-05 DIAGNOSIS — E1165 Type 2 diabetes mellitus with hyperglycemia: Secondary | ICD-10-CM | POA: Diagnosis not present

## 2023-01-05 DIAGNOSIS — M2141 Flat foot [pes planus] (acquired), right foot: Secondary | ICD-10-CM

## 2023-01-05 DIAGNOSIS — M79675 Pain in left toe(s): Secondary | ICD-10-CM

## 2023-01-05 DIAGNOSIS — M2142 Flat foot [pes planus] (acquired), left foot: Secondary | ICD-10-CM | POA: Diagnosis not present

## 2023-01-05 DIAGNOSIS — B351 Tinea unguium: Secondary | ICD-10-CM

## 2023-01-05 DIAGNOSIS — L84 Corns and callosities: Secondary | ICD-10-CM

## 2023-01-05 DIAGNOSIS — M79674 Pain in right toe(s): Secondary | ICD-10-CM | POA: Diagnosis not present

## 2023-01-05 NOTE — Progress Notes (Unsigned)
  Subjective:  Patient ID: Daniel Sanders, male    DOB: September 19, 1945,  MRN: [id number]  Daniel Sanders presents to clinic today for:  Chief Complaint  Patient presents with   Nail Problem    [facility], Referring Provider Jenell Milliner, MD,LOV:02/24,B/S,machine stopped when about to take, A1C: 8.1     Patient is requesting new diabetic shoes. He states he has large feet and needs a size 16 or 17. He has gotten a pair of Propet shoes in the past which carry his size.  PCP is Jenell Milliner, MD.  No Known Allergies  Review of Systems: Negative except as noted in the HPI.  Objective: No changes noted in today's physical examination. Vitals:   01/05/23 1040  BP: (!) 146/74    Daniel Sanders is a pleasant 77 y.o. male in NAD. AAO x 3.  Vascular Examination: Capillary refill time <3 seconds b/l LE. Palpable pedal pulses b/l LE. Digital hair absent b/l. Trace pedal edema b/l. Skin temperature gradient WNL b/l. No varicosities b/l. Marland Kitchen  Dermatological Examination: Pedal skin with normal turgor, texture and tone b/l. No open wounds. No interdigital macerations b/l. Toenails 1-5 b/l thickened, discolored, dystrophic with subungual debris. There is pain on palpation to dorsal aspect of nailplates. .  Neurological Examination: Protective sensation intact with 10 gram monofilament b/l LE. Vibratory sensation intact b/l LE.   Musculoskeletal Examination: Muscle strength 5/5 to all LE muscle groups b/l. Pes planus deformity noted bilateral LE.  Assessment/Plan: 1. Pain due to onychomycosis of toenails of both feet   2. Pes planus of both feet   3. Callus   4. Uncontrolled type 2 diabetes mellitus with hyperglycemia (HCC)     FOR HOME USE ONLY DME DIABETIC SHOE  Orders Placed This Encounter  Procedures   For Home Use Only DME Diabetic Shoe    Dispense one pair extra depth shoes with 3 pair total contact insoles. Size 16 or 17. He has gotten the [employer]pet brand in the past.    -Consent  given for treatment as described below: -Examined patient. -Patient needs diabetic shoes size 16 or 17. -Patient to continue soft, supportive shoe gear daily. -Toenails 1-5 b/l were debrided in length and girth with sterile nail nippers and dremel without iatrogenic bleeding.  -Callus(es) right great toe pared utilizing sterile scalpel blade without complication or incident. Total number debrided =1. -Patient/POA to call should there be question/concern in the interim.   Return in about 3 months (around 04/07/2023).  Freddie Breech, DPM

## 2023-01-06 NOTE — Telephone Encounter (Signed)
error 

## 2023-01-07 ENCOUNTER — Encounter: Payer: Self-pay | Admitting: Podiatry

## 2023-04-09 ENCOUNTER — Encounter: Payer: Self-pay | Admitting: Podiatry

## 2023-04-09 ENCOUNTER — Ambulatory Visit (INDEPENDENT_AMBULATORY_CARE_PROVIDER_SITE_OTHER): Payer: Medicare Other | Admitting: Podiatry

## 2023-04-09 DIAGNOSIS — M2142 Flat foot [pes planus] (acquired), left foot: Secondary | ICD-10-CM | POA: Diagnosis not present

## 2023-04-09 DIAGNOSIS — M79675 Pain in left toe(s): Secondary | ICD-10-CM

## 2023-04-09 DIAGNOSIS — M2141 Flat foot [pes planus] (acquired), right foot: Secondary | ICD-10-CM

## 2023-04-09 DIAGNOSIS — M79674 Pain in right toe(s): Secondary | ICD-10-CM

## 2023-04-09 DIAGNOSIS — E119 Type 2 diabetes mellitus without complications: Secondary | ICD-10-CM

## 2023-04-09 DIAGNOSIS — B351 Tinea unguium: Secondary | ICD-10-CM

## 2023-04-09 DIAGNOSIS — E1165 Type 2 diabetes mellitus with hyperglycemia: Secondary | ICD-10-CM | POA: Diagnosis not present

## 2023-04-16 NOTE — Progress Notes (Signed)
ANNUAL DIABETIC FOOT EXAM  Subjective: Daniel Sanders presents today annual diabetic foot exam.  Chief Complaint  Patient presents with   Nail Problem    DFC,Referring Provider:Ybanez, Gaspar Bidding, MD ,per patient existing primary retired ,lov:08/24,A1C:6.8,BS:134      Patient confirms h/o diabetes.  Patient denies any h/o foot wounds.  Patient has been diagnosed with neuropathy.  Risk factors: diabetes, diabetic neuropathy, history of foot/leg ulcer, HTN, dyslipidemia, h/o tobacco use in remission.  Ardyth Gal, MD is patient's PCP.  Past Medical History:  Diagnosis Date   Arthritis    Diabetes mellitus without complication (HCC)    Hyperlipidemia associated with type 2 diabetes mellitus (HCC)    Hypertension    Obesity    Sleep apnea    per patient, incorrect   Venous insufficiency of both lower extremities 2021   Wears dentures    partial upper and lower   Wears hearing aid in both ears    Patient Active Problem List   Diagnosis Date Noted   History of osteomyelitis 01/23/2022   Right retinal detachment 01/23/2022   Venous stasis 07/01/2021   Obesity 04/20/2020   Lightheadedness    Goals of care, counseling/discussion    Palliative care by specialist    Osteomyelitis of skull (HCC)    Dehydration    Uncontrolled type 2 diabetes mellitus with hyperglycemia (HCC)    Weakness 03/13/2020   Dyslipidemia 07/12/2019   Osteoarthritis 07/12/2019   Venous insufficiency 04/05/2019   Mobility poor 12/21/2017   Knee pain, bilateral 10/03/2013   Hypertension, benign 09/16/2012   Mixed hyperlipidemia 09/16/2012   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   Type II diabetes mellitus with neurological manifestations (HCC) 09/16/2012   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   Past Surgical History:  Procedure Laterality Date   CATARACT EXTRACTION W/PHACO Right 10/21/2021   Procedure: CATARACT EXTRACTION PHACO AND INTRAOCULAR LENS PLACEMENT (IOC) RIGHT  DIABETIC;  Surgeon: Nevada Crane, MD;  Location: [facility];  Service: Ophthalmology;  Laterality: Right;  Diabetic 5.33 00:45.6   MASS EXCISION Right 10/11/2019   Procedure: EXCISION EAR CANAL MASS;  Surgeon: Linus Salmons, MD;  Location: [facility];  Service: ENT;  Laterality: Right;   Current Outpatient Medications on File Prior to Visit  Medication Sig Dispense Refill   aspirin EC 81 MG tablet Take 81 mg by mouth daily.     atorvastatin (LIPITOR) 20 MG tablet Take by mouth.     B12-ACTIVE 1 MG CHEW      furosemide (LASIX) 40 MG tablet Take 40 mg by mouth every evening.     Insulin Syringe-Needle U-100 (GLOBAL INJECT EASE INSULIN SYR) 30G X 1/2" 1 ML MISC 1 each by Other route Two (2) times a day (30 minutes before a meal).     lisinopril (ZESTRIL) 40 MG tablet Take 1 tablet (40 mg total) by mouth daily. 30 tablet 0   metFORMIN (GLUCOPHAGE) 1000 MG tablet Take 1,000 mg by mouth 2 (two) times daily.     NOVOLIN N 100 UNIT/ML injection Inject 0.38 mLs (38 Units total) into the skin 2 (two) times daily before a meal. Inject 70 units subcutaneously in the morning & inject 60 units subcutaneously in the evening 10 mL 11   NOVOLIN R 100 UNIT/ML injection Inject 0.12 mLs (12 Units total) into the skin 3 (three) times daily before meals. Inject 25 units subcutaneously in the morning & inject 20 units subcutaneously in the evening 10 mL 11   tobramycin-dexamethasone (TOBRADEX) ophthalmic  solution Place 4 drops into the right ear 2 (two) times daily. 5 mL 0   amLODipine (NORVASC) 5 MG tablet Take by mouth.     No current facility-administered medications on file prior to visit.    No Known Allergies Social History   Occupational History   Occupation: parole and Engineer, drilling    Comment: retired  Tobacco Use   Smoking status: Former    Current packs/day: 0.00    Types: Cigarettes, Pipe    Quit date: 1995    Years since quitting: 29.6   Smokeless tobacco: Former    Quit  date: 1990  Advertising account planner   Vaping status: Never Used  Substance and Sexual Activity   Alcohol use: Never    Comment: nothing for 36 years   Drug use: Not on file    Comment: nothing in over 30 years   Sexual activity: Not on file   History reviewed. No pertinent family history. Immunization History  Administered Date(s) Administered   Influenza, High Dose Seasonal PF 06/02/2014, 06/27/2015, 06/13/2019   Influenza, Seasonal, Injecte, Preservative Fre 08/05/2011   Influenza,inj,Quad PF,6+ Mos 06/10/2016, 06/29/2018   Influenza-Unspecified 06/02/2014, 06/27/2015, 06/18/2017   PPD Test 07/10/2003   Pneumococcal Conjugate-13 04/11/2014   Pneumococcal Polysaccharide-23 01/23/2009    Review of Systems: Negative except as noted in the HPI.   Objective: There were no vitals filed for this visit.  Pinches Zarrella is a pleasant 77 y.o. male in NAD. AAO X 3.  Vascular Examination: Capillary refill time immediate b/l. Vascular status intact b/l with palpable pedal pulses. Pedal hair absent b/l. No pain with calf compression b/l. Skin temperature gradient WNL b/l. No cyanosis or clubbing b/l. No ischemia or gangrene noted b/l.   Neurological Examination: Sensation grossly intact b/l with 10 gram monofilament. Vibratory sensation intact b/l.   Dermatological Examination: Pedal skin with normal turgor, texture and tone b/l.  No open wounds. No interdigital macerations.   Toenails 1-5 b/l thick, discolored, elongated with subungual debris and pain on dorsal palpation.   No corns, calluses nor porokeratotic lesions noted.  Musculoskeletal Examination: Muscle strength 5/5 to all lower extremity muscle groups bilaterally. Pes planus deformity noted bilateral LE.  Radiographs: None  Lab Results  Component Value Date   HGBA1C 6.8 (H) 03/14/2020   ADA Risk Categorization: High Risk  Patient has one or more of the following: Loss of protective sensation Absent pedal pulses Severe Foot  deformity History of foot ulcer  Assessment: 1. Pain due to onychomycosis of toenails of both feet   2. Pes planus of both feet   3. Uncontrolled type 2 diabetes mellitus with hyperglycemia (HCC)   4. Encounter for diabetic foot exam [facility])     Plan: -Patient was evaluated and treated. All patient's and/or POA's questions/concerns answered on today's visit. -Continue foot and shoe inspections daily. Monitor blood glucose per PCP/Endocrinologist's recommendations. -Patient to continue soft, supportive shoe gear daily. -Toenails 1-5 b/l were debrided in length and girth with sterile nail nippers and dremel without iatrogenic bleeding.  -Patient/POA to call should there be question/concern in the interim. Return in about 3 months (around 07/10/2023).  Freddie Breech, DPM

## 2023-07-10 ENCOUNTER — Ambulatory Visit (INDEPENDENT_AMBULATORY_CARE_PROVIDER_SITE_OTHER): Payer: Medicare Other | Admitting: Podiatry

## 2023-07-10 DIAGNOSIS — Z91199 Patient's noncompliance with other medical treatment and regimen due to unspecified reason: Secondary | ICD-10-CM

## 2023-07-10 NOTE — Progress Notes (Signed)
1. No-show for appointment     

## 2024-02-12 ENCOUNTER — Encounter: Payer: Self-pay | Admitting: Podiatry

## 2024-02-12 ENCOUNTER — Ambulatory Visit (INDEPENDENT_AMBULATORY_CARE_PROVIDER_SITE_OTHER): Admitting: Podiatry

## 2024-02-12 DIAGNOSIS — E1165 Type 2 diabetes mellitus with hyperglycemia: Secondary | ICD-10-CM | POA: Diagnosis not present

## 2024-02-12 DIAGNOSIS — M79674 Pain in right toe(s): Secondary | ICD-10-CM | POA: Diagnosis not present

## 2024-02-12 DIAGNOSIS — M79675 Pain in left toe(s): Secondary | ICD-10-CM

## 2024-02-12 DIAGNOSIS — B351 Tinea unguium: Secondary | ICD-10-CM

## 2024-02-14 ENCOUNTER — Encounter: Payer: Self-pay | Admitting: Podiatry

## 2024-02-14 NOTE — Progress Notes (Signed)
  Subjective:  Patient ID: Daniel Sanders, male    DOB: Jun 12, 1946,  MRN: [id number]  78 y.o. male presents preventative diabetic foot care and painful thick toenails that are difficult to trim. Pain interferes with ambulation. Aggravating factors include wearing enclosed shoe gear. Pain is relieved with periodic professional debridement. He states he developed a spot on his left great toe.Appeared to be s blood blister, but it dried out. He cannot recall any trauma to his left foot. Chief Complaint  Patient presents with   [facility]    Rm3 DFC/Diabetic blood sugar 141/A1c 7.3/ Dr. Alyse last visit December 01, 2023    PCP is Daniel Sanders, Daniel Sanders.  No Known Allergies  Review of Systems: Negative except as noted in the HPI.   Objective:  Daniel Sanders is a pleasant 78 y.o. male in NAD. AAO x 3.  Vascular Examination: Vascular status intact b/l with palpable pedal pulses. CFT immediate b/l. Pedal hair present. No edema. No pain with calf compression b/l. Skin temperature gradient WNL b/l. No varicosities noted. No cyanosis or clubbing noted.  Neurological Examination: Sensation grossly intact b/l with 10 gram monofilament. Vibratory sensation intact b/l.  Dermatological Examination: Dried blood blister dorsal IPJ left great toe. Stable. No erythema, no edema, no drainage. Pedal skin with normal turgor, texture and tone b/l. No open wounds nor interdigital macerations noted. Toenails 1-5 b/l thick, discolored, elongated with subungual debris and pain on dorsal palpation. No hyperkeratotic lesions noted b/l.   Musculoskeletal Examination: Muscle strength 5/5 to b/l LE.  No pain, crepitus noted b/l. No gross pedal deformities. Patient ambulates independently without assistive aids.   Radiographs: None Assessment:   1. Pain due to onychomycosis of toenails of both feet   2. Uncontrolled type 2 diabetes mellitus with hyperglycemia (HCC)    Plan:  Consent given for treatment. Patient examined.  All patient's and/or POA's questions/concerns addressed on today's visit. Blood blister left great toe resolving. No furhter treatment required. Mycotic toenails 1-5 debrided in length and girth without incident. Continue foot and shoe inspections daily. Monitor blood glucose per PCP/Endocrinologist's recommendations.Continue soft, supportive shoe gear daily. Report any pedal injuries to medical professional. Call office if there are any quesitons/concerns. -Patient/POA to call should there be question/concern in the interim.  Return in about 3 months (around 05/14/2024).  Daniel Sanders, DPM      [place] LOCATION: [address], [place] [address]                   Office [phone]   [facility] LOCATION: [address] [place], [place] [address] Office [phone]

## 2024-05-20 ENCOUNTER — Ambulatory Visit: Admitting: Podiatry

## 2024-05-20 ENCOUNTER — Encounter: Payer: Self-pay | Admitting: Podiatry

## 2024-05-20 DIAGNOSIS — E1165 Type 2 diabetes mellitus with hyperglycemia: Secondary | ICD-10-CM

## 2024-05-20 DIAGNOSIS — B351 Tinea unguium: Secondary | ICD-10-CM

## 2024-05-20 DIAGNOSIS — E1142 Type 2 diabetes mellitus with diabetic polyneuropathy: Secondary | ICD-10-CM

## 2024-05-20 DIAGNOSIS — M79674 Pain in right toe(s): Secondary | ICD-10-CM | POA: Diagnosis not present

## 2024-05-20 DIAGNOSIS — M79675 Pain in left toe(s): Secondary | ICD-10-CM

## 2024-05-24 NOTE — Progress Notes (Signed)
  Subjective:  Patient ID: Daniel Sanders, male    DOB: 1945/09/27,  MRN: [id number]  Daniel Sanders presents to clinic today for at risk foot care with history of diabetic neuropathy and painful mycotic toenails x 10 which interfere with daily activities. Pain is relieved with periodic professional debridement.  Chief Complaint  Patient presents with   Toe Pain    Diabetic foot care. Dr. Alyse is his PCP. Last seen in April 2005. A1c 7.0   New problem(s): None.   PCP is [name], MD.  No Known Allergies  Review of Systems: Negative except as noted in the HPI.  Objective:  There were no vitals filed for this visit. Daniel Sanders is a pleasant 78 y.o. male in NAD. AAO x 3.  Vascular Examination: Vascular status intact b/l with palpable pedal pulses. CFT immediate b/l. Pedal hair present. No edema. No pain with calf compression b/l. Skin temperature gradient WNL b/l. No varicosities noted. No cyanosis or clubbing noted.  Neurological Examination: Sensation grossly intact b/l with 10 gram monofilament. Vibratory sensation intact b/l.  Dermatological Examination: Pedal skin with normal turgor, texture and tone b/l. No open wounds nor interdigital macerations noted. Toenails 1-5 b/l thick, discolored, elongated with subungual debris and pain on dorsal palpation. No hyperkeratotic lesions noted b/l.   Musculoskeletal Examination: Muscle strength 5/5 to b/l LE.  No pain, crepitus noted b/l. No gross pedal deformities. Utilizes cane for ambulation assistance.  Radiographs: None  Assessment/Plan: 1. Pain due to onychomycosis of toenails of both feet   2. Diabetic peripheral neuropathy associated with type 2 diabetes mellitus (HCC)   Patient was evaluated and treated. All patient's and/or POA's questions/concerns addressed on today's visit. Toenails 1-5 debrided in length and girth without incident. Continue foot and shoe inspections daily. Monitor blood glucose per  PCP/Endocrinologist's recommendations. Continue soft, supportive shoe gear daily. Report any pedal injuries to medical professional. Call office if there are any questions/concerns. Return in about 3 months (around 08/20/2024).  [name], DPM      [place] LOCATION: [address] [place] [address]                   Office [phone]   [facility] LOCATION: [address] [place], [place] [address] Office [phone]

## 2024-08-22 ENCOUNTER — Ambulatory Visit: Admitting: Podiatry

## 2024-08-22 DIAGNOSIS — B351 Tinea unguium: Secondary | ICD-10-CM | POA: Diagnosis not present

## 2024-08-22 DIAGNOSIS — E1142 Type 2 diabetes mellitus with diabetic polyneuropathy: Secondary | ICD-10-CM | POA: Diagnosis not present

## 2024-08-22 DIAGNOSIS — M2141 Flat foot [pes planus] (acquired), right foot: Secondary | ICD-10-CM | POA: Diagnosis not present

## 2024-08-22 DIAGNOSIS — Z0189 Encounter for other specified special examinations: Secondary | ICD-10-CM

## 2024-08-22 DIAGNOSIS — E119 Type 2 diabetes mellitus without complications: Secondary | ICD-10-CM

## 2024-08-22 DIAGNOSIS — M79675 Pain in left toe(s): Secondary | ICD-10-CM

## 2024-08-22 DIAGNOSIS — M2142 Flat foot [pes planus] (acquired), left foot: Secondary | ICD-10-CM | POA: Diagnosis not present

## 2024-08-22 DIAGNOSIS — M79674 Pain in right toe(s): Secondary | ICD-10-CM | POA: Diagnosis not present

## 2024-08-25 ENCOUNTER — Encounter: Payer: Self-pay | Admitting: Podiatry

## 2024-08-25 NOTE — Progress Notes (Signed)
"  °  Subjective:  Patient ID: Daniel Sanders, male    DOB: Aug 22, 1945,  MRN: [id number]  Daniel Sanders presents to clinic today for preventative diabetic foot care for painful mycotic toenails of both feet that are difficult to trim. Pain interferes with daily activities and wearing enclosed shoe gear comfortably.  Chief Complaint  Patient presents with   Diabetes    A1c 6.8 He saw Dr. Alyse in Oct.   New problem(s): None.   PCP is [name], MD.  Allergies[1]  Review of Systems: Negative except as noted in the HPI.  Objective: No changes noted in today's physical examination. There were no vitals filed for this visit. Daniel Sanders is a pleasant 79 y.o. male in NAD. AAO x 3.  Vascular Examination: Vascular status intact b/l with palpable pedal pulses. CFT immediate b/l. Pedal hair present. No edema. No pain with calf compression b/l. Skin temperature gradient WNL b/l. No varicosities noted. No cyanosis or clubbing noted.  Neurological Examination: Sensation grossly intact b/l with 10 gram monofilament. Vibratory sensation intact b/l.  Dermatological Examination: Pedal skin with normal turgor, texture and tone b/l. No open wounds nor interdigital macerations noted. Toenails 1-5 b/l thick, discolored, elongated with subungual debris and pain on dorsal palpation. No hyperkeratotic lesions noted b/l.   Musculoskeletal Examination: Muscle strength 5/5 to b/l LE.  No pain, crepitus noted b/l. No gross pedal deformities. Utilizes cane for ambulation assistance.  Radiographs: None  Assessment/Plan: 1. Pain due to onychomycosis of toenails of both feet   2. Pes planus of both feet   3. Diabetic peripheral neuropathy associated with type 2 diabetes mellitus (HCC)   4. Encounter for diabetic foot exam (HCC)   Diabetic foot examination performed today. All patient's and/or POA's questions/concerns addressed on today's visit. Toenails 1-5 b/l debrided in length and girth without  incident. Continue foot and shoe inspections daily. Monitor blood glucose per PCP/Endocrinologist's recommendations. Continue soft, supportive shoe gear daily. Report any pedal injuries to medical professional. Call office if there are any questions/concerns. -Patient/POA to call should there be question/concern in the interim.   Return in about 3 months (around 11/20/2024).  Daniel Sanders, DPM      [place] LOCATION: [address] [place] [address]                   Office [phone]   [facility] LOCATION: [address] [place], [place] [address] Office [phone]     [1] No Known Allergies  "

## 2024-11-24 ENCOUNTER — Ambulatory Visit: Admitting: Podiatry
